# Patient Record
Sex: Male | Born: 1945
Health system: Southern US, Community
[De-identification: ages and names within clinical notes are randomized; demographics above are authoritative.]

## PROBLEM LIST (undated history)

## (undated) DIAGNOSIS — R519 Headache, unspecified: Secondary | ICD-10-CM

## (undated) DIAGNOSIS — I456 Pre-excitation syndrome: Secondary | ICD-10-CM

## (undated) DIAGNOSIS — M1990 Unspecified osteoarthritis, unspecified site: Secondary | ICD-10-CM

## (undated) DIAGNOSIS — T7840XA Allergy, unspecified, initial encounter: Secondary | ICD-10-CM

## (undated) DIAGNOSIS — K219 Gastro-esophageal reflux disease without esophagitis: Secondary | ICD-10-CM

## (undated) HISTORY — PX: JOINT REPLACEMENT: SHX530

---

## 1999-03-25 ENCOUNTER — Encounter: Payer: Self-pay | Admitting: Family Medicine

## 1999-03-25 ENCOUNTER — Emergency Department (HOSPITAL_COMMUNITY): Admission: EM | Admit: 1999-03-25 | Discharge: 1999-03-25 | Payer: Self-pay | Admitting: Emergency Medicine

## 2002-03-20 ENCOUNTER — Ambulatory Visit (HOSPITAL_COMMUNITY): Admission: RE | Admit: 2002-03-20 | Discharge: 2002-03-20 | Payer: Self-pay | Admitting: Family Medicine

## 2002-03-20 ENCOUNTER — Encounter: Payer: Self-pay | Admitting: Family Medicine

## 2003-01-23 ENCOUNTER — Ambulatory Visit (HOSPITAL_COMMUNITY): Admission: RE | Admit: 2003-01-23 | Discharge: 2003-01-23 | Payer: Self-pay | Admitting: Gastroenterology

## 2004-03-24 ENCOUNTER — Ambulatory Visit (HOSPITAL_COMMUNITY): Admission: RE | Admit: 2004-03-24 | Discharge: 2004-03-24 | Payer: Self-pay | Admitting: Family Medicine

## 2008-07-04 ENCOUNTER — Encounter: Admission: RE | Admit: 2008-07-04 | Discharge: 2008-07-04 | Payer: Self-pay | Admitting: Sports Medicine

## 2009-04-24 ENCOUNTER — Encounter: Admission: RE | Admit: 2009-04-24 | Discharge: 2009-04-24 | Payer: Self-pay | Admitting: Chiropractic Medicine

## 2009-06-28 ENCOUNTER — Encounter: Admission: RE | Admit: 2009-06-28 | Discharge: 2009-06-28 | Payer: Self-pay | Admitting: Neurological Surgery

## 2009-08-21 ENCOUNTER — Inpatient Hospital Stay (HOSPITAL_COMMUNITY): Admission: RE | Admit: 2009-08-21 | Discharge: 2009-08-23 | Payer: Self-pay | Admitting: Orthopedic Surgery

## 2010-05-11 LAB — PROTIME-INR
INR: 1.7 — ABNORMAL HIGH (ref 0.00–1.49)
INR: 0.99 (ref 0.00–1.49)
Prothrombin Time: 19.8 seconds — ABNORMAL HIGH (ref 11.6–15.2)
Prothrombin Time: 13 seconds (ref 11.6–15.2)
Prothrombin Time: 15 seconds (ref 11.6–15.2)

## 2010-05-11 LAB — CBC
HCT: 43.8 % (ref 39.0–52.0)
MCH: 33.4 pg (ref 26.0–34.0)
MCH: 33.5 pg (ref 26.0–34.0)
MCHC: 33.9 g/dL (ref 30.0–36.0)
MCV: 97.7 fL (ref 78.0–100.0)
MCV: 98.7 fL (ref 78.0–100.0)
Platelets: 136 10*3/uL — ABNORMAL LOW (ref 150–400)
RDW: 13.5 % (ref 11.5–15.5)
RDW: 13.8 % (ref 11.5–15.5)

## 2010-05-11 LAB — BASIC METABOLIC PANEL
BUN: 7 mg/dL (ref 6–23)
BUN: 11 mg/dL (ref 6–23)
CO2: 30 mEq/L (ref 19–32)
CO2: 33 mEq/L — ABNORMAL HIGH (ref 19–32)
Chloride: 101 mEq/L (ref 96–112)
Creatinine, Ser: 1.03 mg/dL (ref 0.4–1.5)
GFR calc Af Amer: >60 mL/min (ref >60)
GFR calc non Af Amer: >60 mL/min (ref >60)
Glucose, Bld: 130 mg/dL — ABNORMAL HIGH (ref 70–99)
Glucose, Bld: 109 mg/dL — ABNORMAL HIGH (ref 70–99)
Sodium: 137 mEq/L (ref 135–145)
Sodium: 137 mEq/L (ref 135–145)

## 2010-05-11 LAB — COMPREHENSIVE METABOLIC PANEL
Albumin: 4.1 g/dL (ref 3.5–5.2)
BUN: 14 mg/dL (ref 6–23)
Creatinine, Ser: 0.91 mg/dL (ref 0.4–1.5)
GFR calc Af Amer: >60 mL/min (ref >60)
GFR calc non Af Amer: >60 mL/min (ref >60)
Glucose, Bld: 122 mg/dL — ABNORMAL HIGH (ref 70–99)
Sodium: 140 mEq/L (ref 135–145)
Total Bilirubin: 0.8 mg/dL (ref 0.3–1.2)
Total Protein: 6.8 g/dL (ref 6.0–8.3)

## 2010-05-11 LAB — URINALYSIS, ROUTINE W REFLEX MICROSCOPIC
Hgb urine dipstick: NEGATIVE
pH: 5.5 (ref 5.0–8.0)

## 2010-05-11 LAB — ABO/RH: ABO/RH(D): A POS

## 2010-05-11 LAB — TYPE AND SCREEN
ABO/RH(D): A POS
Antibody Screen: NEGATIVE

## 2010-07-11 NOTE — Op Note (Signed)
NAME:  Brian Rush, Brian Rush                     ACCOUNT NO.:  [id number]   MEDICAL RECORD NO.:  [id number]                   PATIENT TYPE:  AMB   LOCATION:  ENDO                                 FACILITY:  [facility]   PHYSICIAN:  John C. Madilyn Fireman, M.D.                 DATE OF BIRTH:  10/27/1945   DATE OF PROCEDURE:  01/23/2003  DATE OF DISCHARGE:                                 OPERATIVE REPORT   INDICATIONS FOR PROCEDURE:  Colon cancer screening.   PROCEDURE:  The patient was placed in the left lateral decubitus position  and placed on the pulse monitor with continuous low flow oxygen delivered by  nasal cannula.  She was sedated with 75 mcg of IV fentanyl and 8 mg of IV  Versed.  The Olympus video colonoscope was inserted into the rectum and  advanced to the cecum, clinically confirmed by transillumination of  McBurney's point and visualization of the ileocecal valve and appendiceal  orifice.  The prep was excellent.  The cecum, ascending, transverse,  descending and sigmoid colon all appeared normal with no masses, polyps,  diverticula or other mucosal abnormalities.  The rectum likewise appeared  normal.  On retroflexed view, the anus revealed no obvious internal  hemorrhoids.  The scope was then withdrawn and the patient returned to the  recovery room in stable condition.  He tolerated the procedure well and  there were no immediate complications.   IMPRESSION:  Normal colonoscopy.   PLAN:  The next colon screening by sigmoidoscopy in five years.                                               John C. Madilyn Fireman, M.D.    [facility]  D:  01/23/2003  T:  01/23/2003  Job:  [id number]   cc:   Lyndee Leo, M.D.

## 2010-09-17 ENCOUNTER — Other Ambulatory Visit: Payer: Self-pay | Admitting: Family Medicine

## 2010-09-17 DIAGNOSIS — R221 Localized swelling, mass and lump, neck: Secondary | ICD-10-CM

## 2010-09-23 ENCOUNTER — Ambulatory Visit
Admission: RE | Admit: 2010-09-23 | Discharge: 2010-09-23 | Disposition: A | Discharge status: Home / Self Care | Payer: Medicare Other | Source: Ambulatory Visit | Attending: Family Medicine | Admitting: Family Medicine

## 2010-09-23 DIAGNOSIS — R221 Localized swelling, mass and lump, neck: Secondary | ICD-10-CM

## 2010-12-12 ENCOUNTER — Ambulatory Visit (INDEPENDENT_AMBULATORY_CARE_PROVIDER_SITE_OTHER): Payer: Medicare Other

## 2010-12-12 ENCOUNTER — Inpatient Hospital Stay (INDEPENDENT_AMBULATORY_CARE_PROVIDER_SITE_OTHER)
Admission: RE | Admit: 2010-12-12 | Discharge: 2010-12-12 | Disposition: A | Discharge status: Home / Self Care | Payer: Medicare Other | Source: Ambulatory Visit | Attending: Emergency Medicine | Admitting: Emergency Medicine

## 2010-12-12 DIAGNOSIS — S61209A Unspecified open wound of unspecified finger without damage to nail, initial encounter: Secondary | ICD-10-CM

## 2011-03-17 DIAGNOSIS — H524 Presbyopia: Secondary | ICD-10-CM | POA: Diagnosis not present

## 2011-04-21 DIAGNOSIS — S6000XA Contusion of unspecified finger without damage to nail, initial encounter: Secondary | ICD-10-CM | POA: Diagnosis not present

## 2011-04-23 DIAGNOSIS — S63659A Sprain of metacarpophalangeal joint of unspecified finger, initial encounter: Secondary | ICD-10-CM | POA: Diagnosis not present

## 2011-04-24 ENCOUNTER — Other Ambulatory Visit: Payer: Self-pay | Admitting: Orthopedic Surgery

## 2011-04-24 DIAGNOSIS — M79644 Pain in right finger(s): Secondary | ICD-10-CM

## 2011-04-30 ENCOUNTER — Ambulatory Visit
Admission: RE | Admit: 2011-04-30 | Discharge: 2011-04-30 | Disposition: A | Discharge status: Home / Self Care | Payer: Medicare Other | Source: Ambulatory Visit | Attending: Orthopedic Surgery | Admitting: Orthopedic Surgery

## 2011-04-30 DIAGNOSIS — S6000XA Contusion of unspecified finger without damage to nail, initial encounter: Secondary | ICD-10-CM | POA: Diagnosis not present

## 2011-04-30 DIAGNOSIS — M79644 Pain in right finger(s): Secondary | ICD-10-CM

## 2011-04-30 DIAGNOSIS — M79609 Pain in unspecified limb: Secondary | ICD-10-CM | POA: Diagnosis not present

## 2011-04-30 DIAGNOSIS — S6390XA Sprain of unspecified part of unspecified wrist and hand, initial encounter: Secondary | ICD-10-CM | POA: Diagnosis not present

## 2011-04-30 DIAGNOSIS — S63259A Unspecified dislocation of unspecified finger, initial encounter: Secondary | ICD-10-CM | POA: Diagnosis not present

## 2011-04-30 DIAGNOSIS — S63659A Sprain of metacarpophalangeal joint of unspecified finger, initial encounter: Secondary | ICD-10-CM | POA: Diagnosis not present

## 2011-04-30 MED ORDER — IOHEXOL 180 MG/ML  SOLN
0.5000 mL | Freq: Once | INTRAMUSCULAR | Status: AC | PRN
  Administered 2011-04-30: 0.5 mL via INTRA_ARTICULAR

## 2011-05-11 DIAGNOSIS — Y929 Unspecified place or not applicable: Secondary | ICD-10-CM | POA: Diagnosis not present

## 2011-05-11 DIAGNOSIS — X58XXXA Exposure to other specified factors, initial encounter: Secondary | ICD-10-CM | POA: Diagnosis not present

## 2011-05-11 DIAGNOSIS — Y9389 Activity, other specified: Secondary | ICD-10-CM | POA: Diagnosis not present

## 2011-05-11 DIAGNOSIS — Y998 Other external cause status: Secondary | ICD-10-CM | POA: Diagnosis not present

## 2011-05-11 DIAGNOSIS — S63659A Sprain of metacarpophalangeal joint of unspecified finger, initial encounter: Secondary | ICD-10-CM | POA: Diagnosis not present

## 2011-07-09 DIAGNOSIS — S63659A Sprain of metacarpophalangeal joint of unspecified finger, initial encounter: Secondary | ICD-10-CM | POA: Diagnosis not present

## 2012-01-15 DIAGNOSIS — Z0189 Encounter for other specified special examinations: Secondary | ICD-10-CM | POA: Diagnosis not present

## 2012-01-15 DIAGNOSIS — M1610 Unilateral primary osteoarthritis, unspecified hip: Secondary | ICD-10-CM | POA: Diagnosis not present

## 2012-05-10 DIAGNOSIS — Z0000 Encounter for general adult medical examination without abnormal findings: Secondary | ICD-10-CM | POA: Diagnosis not present

## 2012-05-10 DIAGNOSIS — J309 Allergic rhinitis, unspecified: Secondary | ICD-10-CM | POA: Diagnosis not present

## 2012-05-10 DIAGNOSIS — Z1211 Encounter for screening for malignant neoplasm of colon: Secondary | ICD-10-CM | POA: Diagnosis not present

## 2012-05-10 DIAGNOSIS — E782 Mixed hyperlipidemia: Secondary | ICD-10-CM | POA: Diagnosis not present

## 2012-05-10 DIAGNOSIS — N529 Male erectile dysfunction, unspecified: Secondary | ICD-10-CM | POA: Diagnosis not present

## 2012-05-10 DIAGNOSIS — Z125 Encounter for screening for malignant neoplasm of prostate: Secondary | ICD-10-CM | POA: Diagnosis not present

## 2012-05-10 DIAGNOSIS — N400 Enlarged prostate without lower urinary tract symptoms: Secondary | ICD-10-CM | POA: Diagnosis not present

## 2012-08-01 DIAGNOSIS — Z209 Contact with and (suspected) exposure to unspecified communicable disease: Secondary | ICD-10-CM | POA: Diagnosis not present

## 2012-08-01 DIAGNOSIS — N529 Male erectile dysfunction, unspecified: Secondary | ICD-10-CM | POA: Diagnosis not present

## 2012-08-01 DIAGNOSIS — E782 Mixed hyperlipidemia: Secondary | ICD-10-CM | POA: Diagnosis not present

## 2012-08-01 DIAGNOSIS — Z23 Encounter for immunization: Secondary | ICD-10-CM | POA: Diagnosis not present

## 2012-08-01 DIAGNOSIS — J309 Allergic rhinitis, unspecified: Secondary | ICD-10-CM | POA: Diagnosis not present

## 2012-08-12 DIAGNOSIS — T56894A Toxic effect of other metals, undetermined, initial encounter: Secondary | ICD-10-CM | POA: Diagnosis not present

## 2012-08-12 DIAGNOSIS — M169 Osteoarthritis of hip, unspecified: Secondary | ICD-10-CM | POA: Diagnosis not present

## 2012-08-12 DIAGNOSIS — M1610 Unilateral primary osteoarthritis, unspecified hip: Secondary | ICD-10-CM | POA: Diagnosis not present

## 2012-08-30 DIAGNOSIS — M1610 Unilateral primary osteoarthritis, unspecified hip: Secondary | ICD-10-CM | POA: Diagnosis not present

## 2013-02-07 DIAGNOSIS — Z1211 Encounter for screening for malignant neoplasm of colon: Secondary | ICD-10-CM | POA: Diagnosis not present

## 2013-02-07 DIAGNOSIS — K5730 Diverticulosis of large intestine without perforation or abscess without bleeding: Secondary | ICD-10-CM | POA: Diagnosis not present

## 2013-02-07 DIAGNOSIS — Z800 Family history of malignant neoplasm of digestive organs: Secondary | ICD-10-CM | POA: Diagnosis not present

## 2013-04-27 DIAGNOSIS — H2510 Age-related nuclear cataract, unspecified eye: Secondary | ICD-10-CM | POA: Diagnosis not present

## 2013-07-10 DIAGNOSIS — N529 Male erectile dysfunction, unspecified: Secondary | ICD-10-CM | POA: Diagnosis not present

## 2013-07-10 DIAGNOSIS — J309 Allergic rhinitis, unspecified: Secondary | ICD-10-CM | POA: Diagnosis not present

## 2013-07-10 DIAGNOSIS — E782 Mixed hyperlipidemia: Secondary | ICD-10-CM | POA: Diagnosis not present

## 2013-07-10 DIAGNOSIS — Z125 Encounter for screening for malignant neoplasm of prostate: Secondary | ICD-10-CM | POA: Diagnosis not present

## 2013-07-10 DIAGNOSIS — Z1211 Encounter for screening for malignant neoplasm of colon: Secondary | ICD-10-CM | POA: Diagnosis not present

## 2013-07-10 DIAGNOSIS — N400 Enlarged prostate without lower urinary tract symptoms: Secondary | ICD-10-CM | POA: Diagnosis not present

## 2013-07-10 DIAGNOSIS — K219 Gastro-esophageal reflux disease without esophagitis: Secondary | ICD-10-CM | POA: Diagnosis not present

## 2013-07-10 DIAGNOSIS — Z0000 Encounter for general adult medical examination without abnormal findings: Secondary | ICD-10-CM | POA: Diagnosis not present

## 2013-07-10 DIAGNOSIS — Z23 Encounter for immunization: Secondary | ICD-10-CM | POA: Diagnosis not present

## 2013-12-21 DIAGNOSIS — L821 Other seborrheic keratosis: Secondary | ICD-10-CM | POA: Diagnosis not present

## 2013-12-21 DIAGNOSIS — L570 Actinic keratosis: Secondary | ICD-10-CM | POA: Diagnosis not present

## 2013-12-21 DIAGNOSIS — D1801 Hemangioma of skin and subcutaneous tissue: Secondary | ICD-10-CM | POA: Diagnosis not present

## 2014-03-19 DIAGNOSIS — R202 Paresthesia of skin: Secondary | ICD-10-CM | POA: Diagnosis not present

## 2014-03-19 DIAGNOSIS — N390 Urinary tract infection, site not specified: Secondary | ICD-10-CM | POA: Diagnosis not present

## 2014-03-19 DIAGNOSIS — R300 Dysuria: Secondary | ICD-10-CM | POA: Diagnosis not present

## 2014-03-19 DIAGNOSIS — N529 Male erectile dysfunction, unspecified: Secondary | ICD-10-CM | POA: Diagnosis not present

## 2014-07-16 DIAGNOSIS — Z1211 Encounter for screening for malignant neoplasm of colon: Secondary | ICD-10-CM | POA: Diagnosis not present

## 2014-07-16 DIAGNOSIS — Z209 Contact with and (suspected) exposure to unspecified communicable disease: Secondary | ICD-10-CM | POA: Diagnosis not present

## 2014-07-16 DIAGNOSIS — Z125 Encounter for screening for malignant neoplasm of prostate: Secondary | ICD-10-CM | POA: Diagnosis not present

## 2014-07-16 DIAGNOSIS — L84 Corns and callosities: Secondary | ICD-10-CM | POA: Diagnosis not present

## 2014-07-16 DIAGNOSIS — Z0000 Encounter for general adult medical examination without abnormal findings: Secondary | ICD-10-CM | POA: Diagnosis not present

## 2014-07-16 DIAGNOSIS — H6120 Impacted cerumen, unspecified ear: Secondary | ICD-10-CM | POA: Diagnosis not present

## 2014-07-16 DIAGNOSIS — Z1389 Encounter for screening for other disorder: Secondary | ICD-10-CM | POA: Diagnosis not present

## 2014-07-16 DIAGNOSIS — J309 Allergic rhinitis, unspecified: Secondary | ICD-10-CM | POA: Diagnosis not present

## 2014-07-16 DIAGNOSIS — K219 Gastro-esophageal reflux disease without esophagitis: Secondary | ICD-10-CM | POA: Diagnosis not present

## 2014-07-16 DIAGNOSIS — N529 Male erectile dysfunction, unspecified: Secondary | ICD-10-CM | POA: Diagnosis not present

## 2014-07-16 DIAGNOSIS — N400 Enlarged prostate without lower urinary tract symptoms: Secondary | ICD-10-CM | POA: Diagnosis not present

## 2014-07-16 DIAGNOSIS — E782 Mixed hyperlipidemia: Secondary | ICD-10-CM | POA: Diagnosis not present

## 2014-07-30 ENCOUNTER — Ambulatory Visit (INDEPENDENT_AMBULATORY_CARE_PROVIDER_SITE_OTHER): Payer: Medicare Other | Admitting: Podiatry

## 2014-07-30 ENCOUNTER — Encounter: Payer: Self-pay | Admitting: Podiatry

## 2014-07-30 ENCOUNTER — Ambulatory Visit (INDEPENDENT_AMBULATORY_CARE_PROVIDER_SITE_OTHER): Payer: Medicare Other

## 2014-07-30 VITALS — BP 112/66 | HR 64 | Resp 16

## 2014-07-30 DIAGNOSIS — M779 Enthesopathy, unspecified: Secondary | ICD-10-CM

## 2014-07-30 DIAGNOSIS — R52 Pain, unspecified: Secondary | ICD-10-CM | POA: Diagnosis not present

## 2014-07-30 DIAGNOSIS — L84 Corns and callosities: Secondary | ICD-10-CM | POA: Diagnosis not present

## 2014-07-30 DIAGNOSIS — Q667 Congenital pes cavus: Secondary | ICD-10-CM

## 2014-07-30 DIAGNOSIS — M216X9 Other acquired deformities of unspecified foot: Secondary | ICD-10-CM

## 2014-07-30 MED ORDER — TRIAMCINOLONE ACETONIDE 10 MG/ML IJ SUSP
10.0000 mg | Freq: Once | INTRAMUSCULAR | Status: AC
  Administered 2014-07-30: 10 mg

## 2014-07-30 NOTE — Progress Notes (Signed)
° °  Subjective:    Patient ID: Brian Rush, male    DOB: 1945-08-29, 69 y.o.   MRN: [id number]  HPI   No pain more irritable and bothers with pressure of shoes, happened gradual and acured 2 yrs ago and been shaving it with a razor as a treatment    Review of Systems  Musculoskeletal:       Muscle pain and cramps  All other systems reviewed and are negative.      Objective:   Physical Exam        Assessment & Plan:

## 2014-07-31 NOTE — Progress Notes (Signed)
Subjective:     Patient ID: Brian Rush, male   DOB: 1945/11/22, 69 y.o.   MRN: [id number]  HPI patient states she's getting inflammation and pain around the fifth metatarsal left foot with fluid buildup and there is been keratotic lesion formation that's becoming gradually more tender   Review of Systems  All other systems reviewed and are negative.      Objective:   Physical Exam  Constitutional: He is oriented to person, place, and time.  Cardiovascular: Intact distal pulses.   Musculoskeletal: Normal range of motion.  Neurological: He is oriented to person, place, and time.  Skin: Skin is warm.  Nursing note and vitals reviewed.  neurovascular status intact with muscle strength adequate range of motion within normal limits. Patient's found have good digital perfusion and is well oriented 3 with several lesions on the lateral aspect fifth metatarsal head left with lucent course that are painful when pressed with fluid buildup in the sub-metatarsal area     Assessment:     Inflammatory capsulitis fifth MPJ left with keratotic lesion formation and probable inflammatory changes    Plan:     H&P and condition reviewed and x-rays reviewed with patient. Today I carefully injected the left fifth MPJ plantar capsule 3 mg dexamethasone Kenalog 5 mg Xylocaine and then debrided the lesion fully and applied sterile dressing. Reappoint when symptomatic again

## 2014-10-09 DIAGNOSIS — H2513 Age-related nuclear cataract, bilateral: Secondary | ICD-10-CM | POA: Diagnosis not present

## 2014-12-27 DIAGNOSIS — L218 Other seborrheic dermatitis: Secondary | ICD-10-CM | POA: Diagnosis not present

## 2014-12-27 DIAGNOSIS — D225 Melanocytic nevi of trunk: Secondary | ICD-10-CM | POA: Diagnosis not present

## 2014-12-27 DIAGNOSIS — L570 Actinic keratosis: Secondary | ICD-10-CM | POA: Diagnosis not present

## 2014-12-27 DIAGNOSIS — L821 Other seborrheic keratosis: Secondary | ICD-10-CM | POA: Diagnosis not present

## 2014-12-27 DIAGNOSIS — D1801 Hemangioma of skin and subcutaneous tissue: Secondary | ICD-10-CM | POA: Diagnosis not present

## 2014-12-27 DIAGNOSIS — D485 Neoplasm of uncertain behavior of skin: Secondary | ICD-10-CM | POA: Diagnosis not present

## 2015-01-22 DIAGNOSIS — N529 Male erectile dysfunction, unspecified: Secondary | ICD-10-CM | POA: Diagnosis not present

## 2015-01-22 DIAGNOSIS — Z23 Encounter for immunization: Secondary | ICD-10-CM | POA: Diagnosis not present

## 2015-01-22 DIAGNOSIS — E782 Mixed hyperlipidemia: Secondary | ICD-10-CM | POA: Diagnosis not present

## 2015-01-22 DIAGNOSIS — J309 Allergic rhinitis, unspecified: Secondary | ICD-10-CM | POA: Diagnosis not present

## 2015-01-22 DIAGNOSIS — K219 Gastro-esophageal reflux disease without esophagitis: Secondary | ICD-10-CM | POA: Diagnosis not present

## 2015-02-13 DIAGNOSIS — D485 Neoplasm of uncertain behavior of skin: Secondary | ICD-10-CM | POA: Diagnosis not present

## 2015-02-13 DIAGNOSIS — C44712 Basal cell carcinoma of skin of right lower limb, including hip: Secondary | ICD-10-CM | POA: Diagnosis not present

## 2015-03-06 DIAGNOSIS — C44712 Basal cell carcinoma of skin of right lower limb, including hip: Secondary | ICD-10-CM | POA: Diagnosis not present

## 2015-08-22 DIAGNOSIS — K219 Gastro-esophageal reflux disease without esophagitis: Secondary | ICD-10-CM | POA: Diagnosis not present

## 2015-08-22 DIAGNOSIS — J309 Allergic rhinitis, unspecified: Secondary | ICD-10-CM | POA: Diagnosis not present

## 2015-08-22 DIAGNOSIS — M25532 Pain in left wrist: Secondary | ICD-10-CM | POA: Diagnosis not present

## 2015-08-22 DIAGNOSIS — N529 Male erectile dysfunction, unspecified: Secondary | ICD-10-CM | POA: Diagnosis not present

## 2015-08-22 DIAGNOSIS — E782 Mixed hyperlipidemia: Secondary | ICD-10-CM | POA: Diagnosis not present

## 2015-08-22 DIAGNOSIS — Z1389 Encounter for screening for other disorder: Secondary | ICD-10-CM | POA: Diagnosis not present

## 2015-08-22 DIAGNOSIS — Z1211 Encounter for screening for malignant neoplasm of colon: Secondary | ICD-10-CM | POA: Diagnosis not present

## 2015-08-22 DIAGNOSIS — Z125 Encounter for screening for malignant neoplasm of prostate: Secondary | ICD-10-CM | POA: Diagnosis not present

## 2015-08-22 DIAGNOSIS — Z0000 Encounter for general adult medical examination without abnormal findings: Secondary | ICD-10-CM | POA: Diagnosis not present

## 2015-10-10 DIAGNOSIS — H524 Presbyopia: Secondary | ICD-10-CM | POA: Diagnosis not present

## 2015-10-10 DIAGNOSIS — H2511 Age-related nuclear cataract, right eye: Secondary | ICD-10-CM | POA: Diagnosis not present

## 2015-10-10 DIAGNOSIS — H5213 Myopia, bilateral: Secondary | ICD-10-CM | POA: Diagnosis not present

## 2016-01-06 DIAGNOSIS — D1801 Hemangioma of skin and subcutaneous tissue: Secondary | ICD-10-CM | POA: Diagnosis not present

## 2016-01-06 DIAGNOSIS — Z85828 Personal history of other malignant neoplasm of skin: Secondary | ICD-10-CM | POA: Diagnosis not present

## 2016-01-06 DIAGNOSIS — L821 Other seborrheic keratosis: Secondary | ICD-10-CM | POA: Diagnosis not present

## 2016-01-06 DIAGNOSIS — L570 Actinic keratosis: Secondary | ICD-10-CM | POA: Diagnosis not present

## 2016-03-02 ENCOUNTER — Encounter: Payer: Self-pay | Admitting: Podiatry

## 2016-03-02 ENCOUNTER — Ambulatory Visit (INDEPENDENT_AMBULATORY_CARE_PROVIDER_SITE_OTHER): Payer: Medicare Other | Admitting: Podiatry

## 2016-03-02 DIAGNOSIS — L84 Corns and callosities: Secondary | ICD-10-CM

## 2016-03-02 DIAGNOSIS — M779 Enthesopathy, unspecified: Secondary | ICD-10-CM

## 2016-03-02 MED ORDER — TRIAMCINOLONE ACETONIDE 10 MG/ML IJ SUSP
10.0000 mg | Freq: Once | INTRAMUSCULAR | Status: AC
  Administered 2016-03-02: 10 mg

## 2016-03-04 NOTE — Progress Notes (Signed)
Subjective:     Patient ID: Brian Rush, male   DOB: December 12, 1945, 71 y.o.   MRN: [id number]  HPI patient presents stating I developed a lot of inflammation again in the bottom my left foot and I'm going to [place] hiking   Review of Systems     Objective:   Physical Exam Neurovascular status intact muscle strength adequate with inflammation and pain sub-fifth metatarsal head left with fluid buildup around the joint with tissue formation and thick keratotic lesion formation    Assessment:     Inflammatory capsulitis fifth MPJ left with pain and deformity    Plan:     H&P condition explained to patient and careful injection administered 3 mg dexamethasone Kenalog and then instructed on debridement which was accomplished today and the fact this will recur and most likely one point in future

## 2016-04-08 ENCOUNTER — Encounter: Payer: Self-pay | Admitting: Podiatry

## 2016-04-08 ENCOUNTER — Ambulatory Visit (INDEPENDENT_AMBULATORY_CARE_PROVIDER_SITE_OTHER): Payer: Medicare Other | Admitting: Podiatry

## 2016-04-08 ENCOUNTER — Ambulatory Visit (INDEPENDENT_AMBULATORY_CARE_PROVIDER_SITE_OTHER): Payer: Medicare Other

## 2016-04-08 VITALS — BP 125/71 | HR 64 | Resp 16

## 2016-04-08 DIAGNOSIS — D169 Benign neoplasm of bone and articular cartilage, unspecified: Secondary | ICD-10-CM

## 2016-04-08 DIAGNOSIS — L84 Corns and callosities: Secondary | ICD-10-CM | POA: Diagnosis not present

## 2016-04-09 NOTE — Progress Notes (Signed)
Subjective:     Patient ID: Brian Rush, male   DOB: [birth year], 71 y.o.   MRN: [id number]  HPI patient presents with a lot of pain fifth digit left foot and he's getting ready to go to [place]   Review of Systems     Objective:   Physical Exam Neurovascular status intact negative Homans sign noted with patient found to have distal medial keratotic lesion fifth digit left that's very painful    Assessment:     Lesion secondary to bony exostotic lesion    Plan:     H&P x-ray performed debridement accomplished sterile dressing applied padding applied and instructed that this may require exostectomy. With education given concerning exostosis and treatment options  X-ray report indicated spurring of the medial side fifth digit left

## 2016-10-21 DIAGNOSIS — H2511 Age-related nuclear cataract, right eye: Secondary | ICD-10-CM | POA: Diagnosis not present

## 2016-11-30 ENCOUNTER — Encounter: Payer: Self-pay | Admitting: Podiatry

## 2016-11-30 ENCOUNTER — Ambulatory Visit (INDEPENDENT_AMBULATORY_CARE_PROVIDER_SITE_OTHER): Payer: Medicare Other | Admitting: Podiatry

## 2016-11-30 VITALS — BP 118/73 | HR 59 | Resp 16

## 2016-11-30 DIAGNOSIS — D169 Benign neoplasm of bone and articular cartilage, unspecified: Secondary | ICD-10-CM

## 2016-11-30 DIAGNOSIS — L84 Corns and callosities: Secondary | ICD-10-CM

## 2016-11-30 NOTE — Progress Notes (Signed)
Subjective:    Patient ID: Brian Rush, male   DOB: 71 y.o.   MRN: [id number]   HPI patient presents with a very painful lesion on the inside of the fifth toe left that makes shoe gear difficult and states that it feels like a pin is sticking him and his toe    ROS      Objective:  Physical Exam neurovascular status intact with exostotic keratotic lesion inside fifth toe left it's very painful when pressed     Assessment:    Exostosis fifth digit left     Plan:    Sharp debridement accomplished and discussed surgical exostectomy which she wants done but wants to wait we'll December. I allowed to read him consent form explaining to him procedure and alternative procedures and also complications. Patient wants surgery signed consent form and is scheduled for office surgery for exostectomy with removal of bone spur

## 2016-12-14 ENCOUNTER — Other Ambulatory Visit: Payer: Self-pay | Admitting: Family Medicine

## 2016-12-14 ENCOUNTER — Ambulatory Visit
Admission: RE | Admit: 2016-12-14 | Discharge: 2016-12-14 | Disposition: A | Discharge status: Home / Self Care | Payer: Medicare Other | Source: Ambulatory Visit | Attending: Family Medicine | Admitting: Family Medicine

## 2016-12-14 DIAGNOSIS — E782 Mixed hyperlipidemia: Secondary | ICD-10-CM | POA: Diagnosis not present

## 2016-12-14 DIAGNOSIS — M189 Osteoarthritis of first carpometacarpal joint, unspecified: Secondary | ICD-10-CM | POA: Diagnosis not present

## 2016-12-14 DIAGNOSIS — Z23 Encounter for immunization: Secondary | ICD-10-CM | POA: Diagnosis not present

## 2016-12-14 DIAGNOSIS — N529 Male erectile dysfunction, unspecified: Secondary | ICD-10-CM | POA: Diagnosis not present

## 2016-12-14 DIAGNOSIS — M79644 Pain in right finger(s): Secondary | ICD-10-CM | POA: Diagnosis not present

## 2016-12-14 DIAGNOSIS — K219 Gastro-esophageal reflux disease without esophagitis: Secondary | ICD-10-CM | POA: Diagnosis not present

## 2016-12-14 DIAGNOSIS — J309 Allergic rhinitis, unspecified: Secondary | ICD-10-CM | POA: Diagnosis not present

## 2017-01-11 DIAGNOSIS — D485 Neoplasm of uncertain behavior of skin: Secondary | ICD-10-CM | POA: Diagnosis not present

## 2017-01-11 DIAGNOSIS — L570 Actinic keratosis: Secondary | ICD-10-CM | POA: Diagnosis not present

## 2017-01-11 DIAGNOSIS — L821 Other seborrheic keratosis: Secondary | ICD-10-CM | POA: Diagnosis not present

## 2017-01-11 DIAGNOSIS — D0422 Carcinoma in situ of skin of left ear and external auricular canal: Secondary | ICD-10-CM | POA: Diagnosis not present

## 2017-01-11 DIAGNOSIS — C44712 Basal cell carcinoma of skin of right lower limb, including hip: Secondary | ICD-10-CM | POA: Diagnosis not present

## 2017-01-11 DIAGNOSIS — D1801 Hemangioma of skin and subcutaneous tissue: Secondary | ICD-10-CM | POA: Diagnosis not present

## 2017-02-02 ENCOUNTER — Telehealth: Payer: Self-pay | Admitting: *Deleted

## 2017-02-02 NOTE — Telephone Encounter (Signed)
"  I'm scheduled for surgery with Dr. Ila Mcgill on tomorrow morning.  That's Wednesday morning.  I wanted to check to see if things are still on.  If you could give me a call back.  I'd appreciate it."  I'm returning your call.  You are still scheduled for surgery on tomorrow.  However, we are opening a little later.  Your surgery will be at 9 am.  "What time do I need to be there?"  If you can, be here at 8:45 am.  "Okay, I'll be there.  Will I be able to drive or should I bring my wife with me?"  You will be okay to drive it's your left foot that he's doing.  He'll put you in a surgical shoe afterwards.  "How long will I have to wear that shoe?"  You may be in it for a week or two.  It really depends on how it feels to you.

## 2017-02-03 ENCOUNTER — Ambulatory Visit (INDEPENDENT_AMBULATORY_CARE_PROVIDER_SITE_OTHER): Payer: Medicare Other | Admitting: Podiatry

## 2017-02-03 ENCOUNTER — Encounter: Payer: Self-pay | Admitting: Podiatry

## 2017-02-03 VITALS — BP 118/68 | HR 61 | Temp 96.7°F | Resp 16

## 2017-02-03 DIAGNOSIS — D169 Benign neoplasm of bone and articular cartilage, unspecified: Secondary | ICD-10-CM

## 2017-02-03 NOTE — Progress Notes (Signed)
Subjective:   Patient ID: Brian Rush, male   DOB: 71 y.o.   MRN: [id number]   HPI Patient presents with chronic lesion fifth digit left it has been very painful when pressed and making shoe gear difficult.  Presents for surgical intervention.   ROS      Objective:  Physical Exam  Neurovascular status intact negative Homans sign noted with good digital perfusion noted.  Patient has a distal medial exostosis digit 5 left.     Assessment:  Chronic keratotic lesion digit 5 left distal medial.      Plan:  At this time patient was anesthetized with 60 mg like Marcaine mixture and I then went ahead and this patient was taken to the OR with sterile prep applied.  Tourniquet was inflated 250 mmHg and the following procedure was performed.  Attention was directed to the dorsal medial aspect digit 5 left where a small semielliptical incision was made encompassing the lesion on the fifth digit.  The incision was deepened through subcu tissue down to bone and the intervening skin was removed in toto.  Utilizing a side cutting bur we removed all bone spur and created a paste which was removed from the wound and it was found to be satisfactory resected.  I flushed the wound with copious amounts sterile Garamycin solution and sutured with 4-0 nylon and applied sterile dressing.  Instructed on elevation and reappoint 2 weeks or earlier if any issues should occur and tourniquet was released and patient left the OR in satisfactory condition.  Dispensed a surgical shoe

## 2017-02-17 ENCOUNTER — Encounter: Payer: Medicare Other | Admitting: Podiatry

## 2017-02-18 ENCOUNTER — Ambulatory Visit (INDEPENDENT_AMBULATORY_CARE_PROVIDER_SITE_OTHER): Payer: Medicare Other

## 2017-02-18 ENCOUNTER — Encounter: Payer: Self-pay | Admitting: Podiatry

## 2017-02-18 ENCOUNTER — Ambulatory Visit (INDEPENDENT_AMBULATORY_CARE_PROVIDER_SITE_OTHER): Payer: Medicare Other | Admitting: Podiatry

## 2017-02-18 DIAGNOSIS — D169 Benign neoplasm of bone and articular cartilage, unspecified: Secondary | ICD-10-CM

## 2017-02-22 NOTE — Progress Notes (Signed)
Subjective: Brian Rush is a 71 y.o. is seen today in office s/p left 5th digit exostectomy preformed on 02/03/2017 with Dr. Paulla Dolly.  Patient states doing well and that "my toe feels fine".  He presents today for suture removal.  Denies any recent injury or trauma.  He has been wearing a surgical shoe.  Denies any systemic complaints such as fevers, chills, nausea, vomiting. No calf pain, chest pain, shortness of breath.   Objective: General: No acute distress, AAOx3  DP/PT pulses palpable 2/4, CRT < 3 sec to all digits.  Protective sensation intact. Motor function intact.  LEFT foot: Incision is well coapted without any evidence of dehiscence and sutures are intact. There is no surrounding erythema, ascending cellulitis, fluctuance, crepitus, malodor, drainage/purulence. There is trace edema around the surgical site. There is no pain along the surgical site.  Toes and rectus position. No other areas of tenderness to bilateral lower extremities.  No other open lesions or pre-ulcerative lesions.  No pain with calf compression, swelling, warmth, erythema.   Assessment and Plan:  Status post left fifth toe surgery, doing well with no complications   -Treatment options discussed including all alternatives, risks, and complications -X-rays were obtained and reviewed.  Status post exostectomy left fifth toe.  No evidence of acute fracture identified otherwise. -Sutures removed today without complications.  After removal incision remains well coapted.  Antibiotic ointment was applied followed by a bandage.  He can start to shower tomorrow.  Gradual return to activity. -He can start to transition to regular shoe as able. -Ice/elevation -Pain medication as needed- He has not been taking anything.  -Monitor for any clinical signs or symptoms of infection and DVT/PE and directed to call the office immediately should any occur or go to the ER. -Follow-up as scheduled if he is having any issues or  sooner if any problems arise. In the meantime, encouraged to call the office with any questions, concerns, change in symptoms.   Celesta Gentile, DPM

## 2017-07-27 DIAGNOSIS — E782 Mixed hyperlipidemia: Secondary | ICD-10-CM | POA: Diagnosis not present

## 2017-07-27 DIAGNOSIS — J309 Allergic rhinitis, unspecified: Secondary | ICD-10-CM | POA: Diagnosis not present

## 2017-07-27 DIAGNOSIS — Z0000 Encounter for general adult medical examination without abnormal findings: Secondary | ICD-10-CM | POA: Diagnosis not present

## 2017-07-27 DIAGNOSIS — Z1389 Encounter for screening for other disorder: Secondary | ICD-10-CM | POA: Diagnosis not present

## 2017-07-27 DIAGNOSIS — N529 Male erectile dysfunction, unspecified: Secondary | ICD-10-CM | POA: Diagnosis not present

## 2017-07-27 DIAGNOSIS — Z125 Encounter for screening for malignant neoplasm of prostate: Secondary | ICD-10-CM | POA: Diagnosis not present

## 2017-07-27 DIAGNOSIS — Z1211 Encounter for screening for malignant neoplasm of colon: Secondary | ICD-10-CM | POA: Diagnosis not present

## 2017-07-27 DIAGNOSIS — Z1159 Encounter for screening for other viral diseases: Secondary | ICD-10-CM | POA: Diagnosis not present

## 2017-07-27 DIAGNOSIS — R202 Paresthesia of skin: Secondary | ICD-10-CM | POA: Diagnosis not present

## 2017-07-27 DIAGNOSIS — K219 Gastro-esophageal reflux disease without esophagitis: Secondary | ICD-10-CM | POA: Diagnosis not present

## 2017-07-27 DIAGNOSIS — J069 Acute upper respiratory infection, unspecified: Secondary | ICD-10-CM | POA: Diagnosis not present

## 2017-11-17 DIAGNOSIS — H2513 Age-related nuclear cataract, bilateral: Secondary | ICD-10-CM | POA: Diagnosis not present

## 2018-01-11 DIAGNOSIS — D485 Neoplasm of uncertain behavior of skin: Secondary | ICD-10-CM | POA: Diagnosis not present

## 2018-01-11 DIAGNOSIS — Z85828 Personal history of other malignant neoplasm of skin: Secondary | ICD-10-CM | POA: Diagnosis not present

## 2018-01-11 DIAGNOSIS — L570 Actinic keratosis: Secondary | ICD-10-CM | POA: Diagnosis not present

## 2018-01-11 DIAGNOSIS — D0422 Carcinoma in situ of skin of left ear and external auricular canal: Secondary | ICD-10-CM | POA: Diagnosis not present

## 2018-01-11 DIAGNOSIS — L821 Other seborrheic keratosis: Secondary | ICD-10-CM | POA: Diagnosis not present

## 2018-01-26 DIAGNOSIS — Z23 Encounter for immunization: Secondary | ICD-10-CM | POA: Diagnosis not present

## 2018-04-12 DIAGNOSIS — L570 Actinic keratosis: Secondary | ICD-10-CM | POA: Diagnosis not present

## 2018-05-03 DIAGNOSIS — M25562 Pain in left knee: Secondary | ICD-10-CM | POA: Diagnosis not present

## 2018-07-04 DIAGNOSIS — M1712 Unilateral primary osteoarthritis, left knee: Secondary | ICD-10-CM | POA: Diagnosis not present

## 2018-07-11 DIAGNOSIS — M1712 Unilateral primary osteoarthritis, left knee: Secondary | ICD-10-CM | POA: Diagnosis not present

## 2018-07-19 DIAGNOSIS — M1712 Unilateral primary osteoarthritis, left knee: Secondary | ICD-10-CM | POA: Diagnosis not present

## 2018-07-29 DIAGNOSIS — Z0000 Encounter for general adult medical examination without abnormal findings: Secondary | ICD-10-CM | POA: Diagnosis not present

## 2018-07-29 DIAGNOSIS — N529 Male erectile dysfunction, unspecified: Secondary | ICD-10-CM | POA: Diagnosis not present

## 2018-07-29 DIAGNOSIS — K219 Gastro-esophageal reflux disease without esophagitis: Secondary | ICD-10-CM | POA: Diagnosis not present

## 2018-07-29 DIAGNOSIS — M1711 Unilateral primary osteoarthritis, right knee: Secondary | ICD-10-CM | POA: Diagnosis not present

## 2018-07-29 DIAGNOSIS — R5383 Other fatigue: Secondary | ICD-10-CM | POA: Diagnosis not present

## 2018-07-29 DIAGNOSIS — J309 Allergic rhinitis, unspecified: Secondary | ICD-10-CM | POA: Diagnosis not present

## 2018-07-29 DIAGNOSIS — R600 Localized edema: Secondary | ICD-10-CM | POA: Diagnosis not present

## 2018-07-29 DIAGNOSIS — Z1211 Encounter for screening for malignant neoplasm of colon: Secondary | ICD-10-CM | POA: Diagnosis not present

## 2018-07-29 DIAGNOSIS — E782 Mixed hyperlipidemia: Secondary | ICD-10-CM | POA: Diagnosis not present

## 2018-07-29 DIAGNOSIS — Z1389 Encounter for screening for other disorder: Secondary | ICD-10-CM | POA: Diagnosis not present

## 2018-07-29 DIAGNOSIS — Z125 Encounter for screening for malignant neoplasm of prostate: Secondary | ICD-10-CM | POA: Diagnosis not present

## 2018-08-03 DIAGNOSIS — I8312 Varicose veins of left lower extremity with inflammation: Secondary | ICD-10-CM | POA: Diagnosis not present

## 2018-08-03 DIAGNOSIS — I83813 Varicose veins of bilateral lower extremities with pain: Secondary | ICD-10-CM | POA: Diagnosis not present

## 2018-08-03 DIAGNOSIS — I83893 Varicose veins of bilateral lower extremities with other complications: Secondary | ICD-10-CM | POA: Diagnosis not present

## 2018-08-03 DIAGNOSIS — I8311 Varicose veins of right lower extremity with inflammation: Secondary | ICD-10-CM | POA: Diagnosis not present

## 2018-08-04 DIAGNOSIS — I83893 Varicose veins of bilateral lower extremities with other complications: Secondary | ICD-10-CM | POA: Diagnosis not present

## 2018-08-04 DIAGNOSIS — I8312 Varicose veins of left lower extremity with inflammation: Secondary | ICD-10-CM | POA: Diagnosis not present

## 2018-08-04 DIAGNOSIS — I8311 Varicose veins of right lower extremity with inflammation: Secondary | ICD-10-CM | POA: Diagnosis not present

## 2018-08-09 DIAGNOSIS — I8312 Varicose veins of left lower extremity with inflammation: Secondary | ICD-10-CM | POA: Diagnosis not present

## 2018-08-09 DIAGNOSIS — I8311 Varicose veins of right lower extremity with inflammation: Secondary | ICD-10-CM | POA: Diagnosis not present

## 2018-08-09 DIAGNOSIS — I83893 Varicose veins of bilateral lower extremities with other complications: Secondary | ICD-10-CM | POA: Diagnosis not present

## 2018-08-09 DIAGNOSIS — I83813 Varicose veins of bilateral lower extremities with pain: Secondary | ICD-10-CM | POA: Diagnosis not present

## 2018-08-30 DIAGNOSIS — K640 First degree hemorrhoids: Secondary | ICD-10-CM | POA: Diagnosis not present

## 2018-08-30 DIAGNOSIS — D120 Benign neoplasm of cecum: Secondary | ICD-10-CM | POA: Diagnosis not present

## 2018-08-30 DIAGNOSIS — Z800 Family history of malignant neoplasm of digestive organs: Secondary | ICD-10-CM | POA: Diagnosis not present

## 2018-08-30 DIAGNOSIS — K5730 Diverticulosis of large intestine without perforation or abscess without bleeding: Secondary | ICD-10-CM | POA: Diagnosis not present

## 2018-08-30 DIAGNOSIS — D122 Benign neoplasm of ascending colon: Secondary | ICD-10-CM | POA: Diagnosis not present

## 2018-09-02 DIAGNOSIS — D122 Benign neoplasm of ascending colon: Secondary | ICD-10-CM | POA: Diagnosis not present

## 2018-09-02 DIAGNOSIS — D120 Benign neoplasm of cecum: Secondary | ICD-10-CM | POA: Diagnosis not present

## 2018-12-01 DIAGNOSIS — H524 Presbyopia: Secondary | ICD-10-CM | POA: Diagnosis not present

## 2018-12-01 DIAGNOSIS — H52223 Regular astigmatism, bilateral: Secondary | ICD-10-CM | POA: Diagnosis not present

## 2018-12-01 DIAGNOSIS — H5213 Myopia, bilateral: Secondary | ICD-10-CM | POA: Diagnosis not present

## 2018-12-15 ENCOUNTER — Other Ambulatory Visit: Payer: Self-pay

## 2018-12-15 ENCOUNTER — Other Ambulatory Visit: Payer: Self-pay | Admitting: Chiropractic Medicine

## 2018-12-15 ENCOUNTER — Ambulatory Visit
Admission: RE | Admit: 2018-12-15 | Discharge: 2018-12-15 | Disposition: A | Discharge status: Home / Self Care | Payer: Medicare Other | Source: Ambulatory Visit | Attending: Chiropractic Medicine | Admitting: Chiropractic Medicine

## 2018-12-15 DIAGNOSIS — M25511 Pain in right shoulder: Secondary | ICD-10-CM | POA: Diagnosis not present

## 2019-01-10 DIAGNOSIS — L821 Other seborrheic keratosis: Secondary | ICD-10-CM | POA: Diagnosis not present

## 2019-01-10 DIAGNOSIS — L570 Actinic keratosis: Secondary | ICD-10-CM | POA: Diagnosis not present

## 2019-01-10 DIAGNOSIS — D1801 Hemangioma of skin and subcutaneous tissue: Secondary | ICD-10-CM | POA: Diagnosis not present

## 2019-01-23 DIAGNOSIS — M1711 Unilateral primary osteoarthritis, right knee: Secondary | ICD-10-CM | POA: Diagnosis not present

## 2019-01-23 DIAGNOSIS — E782 Mixed hyperlipidemia: Secondary | ICD-10-CM | POA: Diagnosis not present

## 2019-01-23 DIAGNOSIS — N400 Enlarged prostate without lower urinary tract symptoms: Secondary | ICD-10-CM | POA: Diagnosis not present

## 2019-03-26 ENCOUNTER — Ambulatory Visit: Payer: Medicare Other

## 2019-03-30 ENCOUNTER — Ambulatory Visit: Payer: Medicare Other | Attending: Internal Medicine

## 2019-03-30 DIAGNOSIS — Z23 Encounter for immunization: Secondary | ICD-10-CM | POA: Insufficient documentation

## 2019-03-30 NOTE — Progress Notes (Signed)
   Covid-19 Vaccination Clinic  Name:  Brian Rush    MRN: [id number] DOB: 01-05-1946  03/30/2019  [name] was observed post Covid-19 immunization for 15 minutes without incidence. He was provided with Vaccine Information Sheet and instruction to access the V-Safe system.   [name] was instructed to call 911 with any severe reactions post vaccine: Marland Kitchen Difficulty breathing  . Swelling of your face and throat  . A fast heartbeat  . A bad rash all over your body  . Dizziness and weakness    Immunizations Administered    Name Date Dose VIS Date Route   Pfizer COVID-19 Vaccine 03/30/2019  8:56 AM 0.3 mL 02/03/2019 Intramuscular   Manufacturer: [place]   Lot: [id number]   [place]: [id number]

## 2019-04-04 DIAGNOSIS — M1711 Unilateral primary osteoarthritis, right knee: Secondary | ICD-10-CM | POA: Diagnosis not present

## 2019-04-04 DIAGNOSIS — E782 Mixed hyperlipidemia: Secondary | ICD-10-CM | POA: Diagnosis not present

## 2019-04-04 DIAGNOSIS — N400 Enlarged prostate without lower urinary tract symptoms: Secondary | ICD-10-CM | POA: Diagnosis not present

## 2019-04-06 ENCOUNTER — Ambulatory Visit: Payer: Medicare Other

## 2019-04-20 DIAGNOSIS — M1712 Unilateral primary osteoarthritis, left knee: Secondary | ICD-10-CM | POA: Diagnosis not present

## 2019-04-24 ENCOUNTER — Ambulatory Visit: Payer: Medicare Other | Attending: Internal Medicine

## 2019-04-24 DIAGNOSIS — Z23 Encounter for immunization: Secondary | ICD-10-CM | POA: Insufficient documentation

## 2019-04-24 NOTE — Progress Notes (Signed)
   Covid-19 Vaccination Clinic  Name:  Brian Rush    MRN: [id number] DOB: 1945/11/21  04/24/2019  [name] was observed post Covid-19 immunization for 15 minutes without incidence. He was provided with Vaccine Information Sheet and instruction to access the V-Safe system.   [name] was instructed to call 911 with any severe reactions post vaccine: Marland Kitchen Difficulty breathing  . Swelling of your face and throat  . A fast heartbeat  . A bad rash all over your body  . Dizziness and weakness    Immunizations Administered    Name Date Dose VIS Date Route   Pfizer COVID-19 Vaccine 04/24/2019  1:35 PM 0.3 mL 02/03/2019 Intramuscular   Manufacturer: [place]   Lot: [id number]   [place]: [id number]

## 2019-05-11 ENCOUNTER — Ambulatory Visit: Payer: Self-pay | Admitting: Physician Assistant

## 2019-05-11 DIAGNOSIS — M1712 Unilateral primary osteoarthritis, left knee: Secondary | ICD-10-CM | POA: Diagnosis not present

## 2019-05-11 NOTE — H&P (Signed)
TOTAL KNEE ADMISSION H&P  Patient is being admitted for left total knee arthroplasty.  Subjective:  Chief Complaint:left knee pain.  HPI: Brian Rush [name], 74 y.o. male, has a history of pain and functional disability in the left knee due to arthritis and has failed non-surgical conservative treatments for greater than 12 weeks to includeNSAID's and/or analgesics, corticosteriod injections, viscosupplementation injections and activity modification.  Onset of symptoms was gradual, starting 7 years ago with gradually worsening course since that time. The patient noted no past surgery on the left knee(s).  Patient currently rates pain in the left knee(s) at 8 out of 10 with activity. Patient has night pain, worsening of pain with activity and weight bearing, pain that interferes with activities of daily living, pain with passive range of motion, crepitus and joint swelling.  Patient has evidence of periarticular osteophytes and joint space narrowing by imaging studies.  There is no active infection.  There are no problems to display for this patient.  No past medical history on file.  No past surgical history on file.  Current Outpatient Medications  Medication Sig Dispense Refill Last Dose  . co-enzyme Q-10 30 MG capsule Take 100 mg by mouth 3 (three) times daily.     Marland Kitchen glucosamine-chondroitin 500-400 MG tablet Take 1 tablet by mouth 3 (three) times daily.     . Multiple Vitamin (MULTIVITAMIN WITH MINERALS) TABS tablet Take 1 tablet by mouth daily.     . rosuvastatin (CRESTOR) 10 MG tablet Take 10 mg by mouth daily.     . sildenafil (VIAGRA) 25 MG tablet Take 50 mg by mouth daily as needed for erectile dysfunction.      No current facility-administered medications for this visit.   No Known Allergies  Social History   Tobacco Use  . Smoking status: Never Smoker  . Smokeless tobacco: Never Used  Substance Use Topics  . Alcohol use: Not on file    No family history on file.   Review  of Systems  HENT: Positive for hearing loss and nosebleeds.   Cardiovascular: Positive for leg swelling.  Musculoskeletal: Positive for arthralgias and joint swelling.  Neurological: Positive for headaches.  All other systems reviewed and are negative.   Objective:  Physical Exam  Constitutional: He is oriented to person, place, and time. He appears well-developed and well-nourished. No distress.  HENT:  Head: Normocephalic and atraumatic.  Eyes: Pupils are equal, round, and reactive to light. Conjunctivae and EOM are normal.  Cardiovascular: Normal rate, regular rhythm, normal heart sounds and intact distal pulses.  Respiratory: Effort normal and breath sounds normal. No respiratory distress. He has no wheezes.  GI: Soft. Bowel sounds are normal. He exhibits no distension. There is no abdominal tenderness.  Musculoskeletal:     Cervical back: Normal range of motion and neck supple.     Left knee: Swelling and bony tenderness present. No effusion or erythema. Normal range of motion. Tenderness present.  Lymphadenopathy:    He has no cervical adenopathy.  Neurological: He is alert and oriented to person, place, and time.  Skin: Skin is warm and dry. No rash noted. No erythema.  Psychiatric: He has a normal mood and affect. His behavior is normal.    Vital signs in last 24 hours: @VSRANGES @  Labs:   There is no height or weight on file to calculate BMI.   Imaging Review Plain radiographs demonstrate moderate degenerative joint disease of the left knee(s). The overall alignment ismild valgus. The bone  quality appears to be good for age and reported activity level.      Assessment/Plan:  End stage arthritis, left knee   The patient history, physical examination, clinical judgment of the provider and imaging studies are consistent with end stage degenerative joint disease of the left knee(s) and total knee arthroplasty is deemed medically necessary. The treatment options  including medical management, injection therapy arthroscopy and arthroplasty were discussed at length. The risks and benefits of total knee arthroplasty were presented and reviewed. The risks due to aseptic loosening, infection, stiffness, patella tracking problems, thromboembolic complications and other imponderables were discussed. The patient acknowledged the explanation, agreed to proceed with the plan and consent was signed. Patient is being admitted for inpatient treatment for surgery, pain control, PT, OT, prophylactic antibiotics, VTE prophylaxis, progressive ambulation and ADL's and discharge planning. The patient is planning to be discharged home with home health services    Anticipated LOS equal to or greater than 2 midnights due to - Age 79 and older with one or more of the following:  - Obesity  - Expected need for hospital services (PT, OT, Nursing) required for safe  discharge  - Anticipated need for postoperative skilled nursing care or inpatient rehab  - Active co-morbidities: None OR   - Unanticipated findings during/Post Surgery: None  - Patient is a high risk of re-admission due to: None

## 2019-05-11 NOTE — H&P (View-Only) (Signed)
TOTAL KNEE ADMISSION H&P  Patient is being admitted for left total knee arthroplasty.  Subjective:  Chief Complaint:left knee pain.  HPI: Brian Rush [facility], 74 y.o. male, has a history of pain and functional disability in the left knee due to arthritis and has failed non-surgical conservative treatments for greater than 12 weeks to includeNSAID's and/or analgesics, corticosteriod injections, viscosupplementation injections and activity modification.  Onset of symptoms was gradual, starting 7 years ago with gradually worsening course since that time. The patient noted no past surgery on the left knee(s).  Patient currently rates pain in the left knee(s) at 8 out of 10 with activity. Patient has night pain, worsening of pain with activity and weight bearing, pain that interferes with activities of daily living, pain with passive range of motion, crepitus and joint swelling.  Patient has evidence of periarticular osteophytes and joint space narrowing by imaging studies.  There is no active infection.  There are no problems to display for this patient.  No past medical history on file.  No past surgical history on file.  Current Outpatient Medications  Medication Sig Dispense Refill Last Dose  . co-enzyme Q-10 30 MG capsule Take 100 mg by mouth 3 (three) times daily.     Marland Kitchen glucosamine-chondroitin 500-400 MG tablet Take 1 tablet by mouth 3 (three) times daily.     . Multiple Vitamin (MULTIVITAMIN WITH MINERALS) TABS tablet Take 1 tablet by mouth daily.     . rosuvastatin (CRESTOR) 10 MG tablet Take 10 mg by mouth daily.     . sildenafil (VIAGRA) 25 MG tablet Take 50 mg by mouth daily as needed for erectile dysfunction.      No current facility-administered medications for this visit.   No Known Allergies  Social History   Tobacco Use  . Smoking status: Never Smoker  . Smokeless tobacco: Never Used  Substance Use Topics  . Alcohol use: Not on file    No family history on file.   Review  of Systems  HENT: Positive for hearing loss and nosebleeds.   Cardiovascular: Positive for leg swelling.  Musculoskeletal: Positive for arthralgias and joint swelling.  Neurological: Positive for headaches.  All other systems reviewed and are negative.   Objective:  Physical Exam  Constitutional: He is oriented to person, place, and time. He appears well-developed and well-nourished. No distress.  HENT:  Head: Normocephalic and atraumatic.  Eyes: Pupils are equal, round, and reactive to light. Conjunctivae and EOM are normal.  Cardiovascular: Normal rate, regular rhythm, normal heart sounds and intact distal pulses.  Respiratory: Effort normal and breath sounds normal. No respiratory distress. He has no wheezes.  GI: Soft. Bowel sounds are normal. He exhibits no distension. There is no abdominal tenderness.  Musculoskeletal:     Cervical back: Normal range of motion and neck supple.     Left knee: Swelling and bony tenderness present. No effusion or erythema. Normal range of motion. Tenderness present.  Lymphadenopathy:    He has no cervical adenopathy.  Neurological: He is alert and oriented to person, place, and time.  Skin: Skin is warm and dry. No rash noted. No erythema.  Psychiatric: He has a normal mood and affect. His behavior is normal.    Vital signs in last 24 hours: @VSRANGES @  Labs:   There is no height or weight on file to calculate BMI.   Imaging Review Plain radiographs demonstrate moderate degenerative joint disease of the left knee(s). The overall alignment ismild valgus. The bone  quality appears to be good for age and reported activity level.      Assessment/Plan:  End stage arthritis, left knee   The patient history, physical examination, clinical judgment of the provider and imaging studies are consistent with end stage degenerative joint disease of the left knee(s) and total knee arthroplasty is deemed medically necessary. The treatment options  including medical management, injection therapy arthroscopy and arthroplasty were discussed at length. The risks and benefits of total knee arthroplasty were presented and reviewed. The risks due to aseptic loosening, infection, stiffness, patella tracking problems, thromboembolic complications and other imponderables were discussed. The patient acknowledged the explanation, agreed to proceed with the plan and consent was signed. Patient is being admitted for inpatient treatment for surgery, pain control, PT, OT, prophylactic antibiotics, VTE prophylaxis, progressive ambulation and ADL's and discharge planning. The patient is planning to be discharged home with home health services    Anticipated LOS equal to or greater than 2 midnights due to - Age 25 and older with one or more of the following:  - Obesity  - Expected need for hospital services (PT, OT, Nursing) required for safe  discharge  - Anticipated need for postoperative skilled nursing care or inpatient rehab  - Active co-morbidities: None OR   - Unanticipated findings during/Post Surgery: None  - Patient is a high risk of re-admission due to: None

## 2019-05-18 NOTE — Progress Notes (Addendum)
PCP - Quran Moreen Fowler w/ surgical and cardiac clearance on chart. Cardiologist -   Chest x-ray -  EKG -  Stress Test -  ECHO -  Cardiac Cath -   Sleep Study -  CPAP -   Fasting Blood Sugar -  Checks Blood Sugar _____ times a day  Blood Thinner Instructions: Aspirin Instructions: Last Dose:  Anesthesia review:   Patient denies shortness of breath, fever, cough and chest pain at PAT appointment   Patient verbalized understanding of instructions that were given to them at the PAT appointment. Patient was also instructed that they will need to review over the PAT instructions again at home before surgery.

## 2019-05-18 NOTE — Patient Instructions (Addendum)
DUE TO COVID-19 ONLY ONE VISITOR IS ALLOWED TO COME WITH YOU AND STAY IN THE WAITING ROOM ONLY DURING PRE OP AND PROCEDURE DAY OF SURGERY. THE 1 VISITOR MAY VISIT WITH YOU AFTER SURGERY IN YOUR PRIVATE ROOM DURING VISITING HOURS ONLY!  YOU NEED TO HAVE A COVID 19 TEST ON 05-30-19 @ 3:00 PM, THIS TEST MUST BE DONE BEFORE SURGERY, COME  [place], [place] , [address].  ([place]) ONCE YOUR COVID TEST IS COMPLETED, PLEASE BEGIN THE QUARANTINE INSTRUCTIONS AS OUTLINED IN YOUR HANDOUT.                Brian Rush [facility]  05/18/2019   Your procedure is scheduled on: 06-02-19   Report to [facility] Main  Entrance    Report to Short Stay  at 5:30 AM     Call this number if you have problems the morning of surgery [phone]    Remember: NO SOLID FOOD AFTER MIDNIGHT THE NIGHT PRIOR TO SURGERY. NOTHING BY MOUTH EXCEPT CLEAR LIQUIDS UNTIL 4:30 AM . PLEASE FINISH ENSURE DRINK PER SURGEON ORDER  WHICH NEEDS TO BE COMPLETED AT 4:30 AM .   CLEAR LIQUID DIET   Foods Allowed                                                                     Foods Excluded  Coffee and tea, regular and decaf                             liquids that you cannot  Plain Jell-O any favor except red or purple                                           see through such as: Fruit ices (not with fruit pulp)                                     milk, soups, orange juice  Iced Popsicles                                    All solid food Carbonated beverages, regular and diet                                    Cranberry, grape and apple juices Sports drinks like Gatorade Lightly seasoned clear broth or consume(fat free) Sugar, honey syrup                                                  _____________________________________________________________________       Take these medicines the morning of surgery with A SIP OF WATER: Claritin. You may also use your nasal spray and eyedrops.  BRUSH YOUR TEETH MORNING OF SURGERY AND RINSE YOUR MOUTH OUT, NO CHEWING GUM CANDY OR MINTS.                                 You may not have any metal on your body including hair pins and              piercings      Do not wear jewelry, cologne, lotions, powders or deodorant              Men may shave face and neck.   Do not bring valuables to the hospital. [place].  Contacts, dentures or bridgework may not be worn into surgery.  You may bring an overnight bag    Special Instructions: N/A              Please read over the following fact sheets you were given: _____________________________________________________________________             [facility] - Preparing for Surgery Before surgery, you can play an important role.  Because skin is not sterile, your skin needs to be as free of germs as possible.  You can reduce the number of germs on your skin by washing with CHG (chlorahexidine gluconate) soap before surgery.  CHG is an antiseptic cleaner which kills germs and bonds with the skin to continue killing germs even after washing. Please DO NOT use if you have an allergy to CHG or antibacterial soaps.  If your skin becomes reddened/irritated stop using the CHG and inform your nurse when you arrive at Short Stay. Do not shave (including legs and underarms) for at least 48 hours prior to the first CHG shower.  You may shave your face/neck. Please follow these instructions carefully:  1.  Shower with CHG Soap the night before surgery and the  morning of Surgery.  2.  If you choose to wash your hair, wash your hair first as usual with your  normal  shampoo.  3.  After you shampoo, rinse your hair and body thoroughly to remove the  shampoo.                           4.  Use CHG as you would any other liquid soap.  You can apply chg directly  to the skin and wash                       Gently with a scrungie or clean washcloth.  5.   Apply the CHG Soap to your body ONLY FROM THE NECK DOWN.   Do not use on face/ open                           Wound or open sores. Avoid contact with eyes, ears mouth and genitals (private parts).                       Wash face,  Genitals (private parts) with your normal soap.             6.  Wash thoroughly, paying special attention to the area where your surgery  will be performed.  7.  Thoroughly rinse your body  with warm water from the neck down.  8.  DO NOT shower/wash with your normal soap after using and rinsing off  the CHG Soap.                9.  Pat yourself dry with a clean towel.            10.  Wear clean pajamas.            11.  Place clean sheets on your bed the night of your first shower and do not  sleep with pets. Day of Surgery : Do not apply any lotions/deodorants the morning of surgery.  Please wear clean clothes to the hospital/surgery center.  FAILURE TO FOLLOW THESE INSTRUCTIONS MAY RESULT IN THE CANCELLATION OF YOUR SURGERY PATIENT SIGNATURE_________________________________  NURSE SIGNATURE__________________________________  ________________________________________________________________________   Brian Rush  An incentive spirometer is a tool that can help keep your lungs clear and active. This tool measures how well you are filling your lungs with each breath. Taking long deep breaths may help reverse or decrease the chance of developing breathing (pulmonary) problems (especially infection) following:  A long period of time when you are unable to move or be active. BEFORE THE PROCEDURE   If the spirometer includes an indicator to show your best effort, your nurse or respiratory therapist will set it to a desired goal.  If possible, sit up straight or lean slightly forward. Try not to slouch.  Hold the incentive spirometer in an upright position. INSTRUCTIONS FOR USE  1. Sit on the edge of your bed if possible, or sit up as far as you can in bed  or on a chair. 2. Hold the incentive spirometer in an upright position. 3. Breathe out normally. 4. Place the mouthpiece in your mouth and seal your lips tightly around it. 5. Breathe in slowly and as deeply as possible, raising the piston or the ball toward the top of the column. 6. Hold your breath for 3-5 seconds or for as long as possible. Allow the piston or ball to fall to the bottom of the column. 7. Remove the mouthpiece from your mouth and breathe out normally. 8. Rest for a few seconds and repeat Steps 1 through 7 at least 10 times every 1-2 hours when you are awake. Take your time and take a few normal breaths between deep breaths. 9. The spirometer may include an indicator to show your best effort. Use the indicator as a goal to work toward during each repetition. 10. After each set of 10 deep breaths, practice coughing to be sure your lungs are clear. If you have an incision (the cut made at the time of surgery), support your incision when coughing by placing a pillow or rolled up towels firmly against it. Once you are able to get out of bed, walk around indoors and cough well. You may stop using the incentive spirometer when instructed by your caregiver.  RISKS AND COMPLICATIONS  Take your time so you do not get dizzy or light-headed.  If you are in pain, you may need to take or ask for pain medication before doing incentive spirometry. It is harder to take a deep breath if you are having pain. AFTER USE  Rest and breathe slowly and easily.  It can be helpful to keep track of a log of your progress. Your caregiver can provide you with a simple table to help with this. If you are using the spirometer at home, follow these  instructions: SEEK MEDICAL CARE IF:   You are having difficultly using the spirometer.  You have trouble using the spirometer as often as instructed.  Your pain medication is not giving enough relief while using the spirometer.  You develop fever of 100.5  F (38.1 C) or higher. SEEK IMMEDIATE MEDICAL CARE IF:   You cough up bloody sputum that had not been present before.  You develop fever of 102 F (38.9 C) or greater.  You develop worsening pain at or near the incision site. MAKE SURE YOU:   Understand these instructions.  Will watch your condition.  Will get help right away if you are not doing well or get worse. Document Released: 06/22/2006 Document Revised: 05/04/2011 Document Reviewed: 08/23/2006 ExitCare Patient Information 2014 ExitCare, [place].   ________________________________________________________________________  WHAT IS A BLOOD TRANSFUSION? Blood Transfusion Information  A transfusion is the replacement of blood or some of its parts. Blood is made up of multiple cells which provide different functions.  Red blood cells carry oxygen and are used for blood loss replacement.  White blood cells fight against infection.  Platelets control bleeding.  Plasma helps clot blood.  Other blood products are available for specialized needs, such as hemophilia or other clotting disorders. BEFORE THE TRANSFUSION  Who gives blood for transfusions?   Healthy volunteers who are fully evaluated to make sure their blood is safe. This is blood bank blood. Transfusion therapy is the safest it has ever been in the practice of medicine. Before blood is taken from a donor, a complete history is taken to make sure that person has no history of diseases nor engages in risky social behavior (examples are intravenous drug use or sexual activity with multiple partners). The donor's travel history is screened to minimize risk of transmitting infections, such as malaria. The donated blood is tested for signs of infectious diseases, such as HIV and hepatitis. The blood is then tested to be sure it is compatible with you in order to minimize the chance of a transfusion reaction. If you or a relative donates blood, this is often done in  anticipation of surgery and is not appropriate for emergency situations. It takes many days to process the donated blood. RISKS AND COMPLICATIONS Although transfusion therapy is very safe and saves many lives, the main dangers of transfusion include:   Getting an infectious disease.  Developing a transfusion reaction. This is an allergic reaction to something in the blood you were given. Every precaution is taken to prevent this. The decision to have a blood transfusion has been considered carefully by your caregiver before blood is given. Blood is not given unless the benefits outweigh the risks. AFTER THE TRANSFUSION  Right after receiving a blood transfusion, you will usually feel much better and more energetic. This is especially true if your red blood cells have gotten low (anemic). The transfusion raises the level of the red blood cells which carry oxygen, and this usually causes an energy increase.  The nurse administering the transfusion will monitor you carefully for complications. HOME CARE INSTRUCTIONS  No special instructions are needed after a transfusion. You may find your energy is better. Speak with your caregiver about any limitations on activity for underlying diseases you may have. SEEK MEDICAL CARE IF:   Your condition is not improving after your transfusion.  You develop redness or irritation at the intravenous (IV) site. SEEK IMMEDIATE MEDICAL CARE IF:  Any of the following symptoms occur over the next 12 hours:  Shaking chills.  You have a temperature by mouth above 102 F (38.9 C), not controlled by medicine.  Chest, back, or muscle pain.  People around you feel you are not acting correctly or are confused.  Shortness of breath or difficulty breathing.  Dizziness and fainting.  You get a rash or develop hives.  You have a decrease in urine output.  Your urine turns a dark color or changes to pink, red, or brown. Any of the following symptoms occur over  the next 10 days:  You have a temperature by mouth above 102 F (38.9 C), not controlled by medicine.  Shortness of breath.  Weakness after normal activity.  The white part of the eye turns yellow (jaundice).  You have a decrease in the amount of urine or are urinating less often.  Your urine turns a dark color or changes to pink, red, or brown. Document Released: 02/07/2000 Document Revised: 05/04/2011 Document Reviewed: 09/26/2007 [facility] Patient Information 2014 [place], [place].  _______________________________________________________________________

## 2019-05-19 NOTE — Care Plan (Signed)
Ortho Bundle Case Management Note  Patient Details  Name: Brian Rush MRN: [id number] Date of Birth: 1946-01-27   spoke with patient prior to surgery. He will discharge to home with family to assist. Rolling walker and CPM ordered. HHPT referral to [facility]. OPPT set up at [facility].  Patient and MD in agreement with plan. Choice offered.                   DME Arranged:  Gilford Rile rolling, CPM DME Agency:  [employer]  HH Arranged:  PT [place] Agency:  [facility]  Additional Comments: Please contact me with any questions of if this plan should need to change.  Ladell Heads,  [place] [facility]  [phone] 05/19/2019, 12:25 PM

## 2019-05-22 ENCOUNTER — Other Ambulatory Visit: Payer: Self-pay

## 2019-05-22 ENCOUNTER — Encounter (HOSPITAL_COMMUNITY)
Admission: RE | Admit: 2019-05-22 | Discharge: 2019-05-22 | Disposition: A | Discharge status: Home / Self Care | Payer: Medicare Other | Source: Ambulatory Visit | Attending: Orthopedic Surgery | Admitting: Orthopedic Surgery

## 2019-05-22 ENCOUNTER — Encounter (HOSPITAL_COMMUNITY): Payer: Self-pay

## 2019-05-22 DIAGNOSIS — Z01812 Encounter for preprocedural laboratory examination: Secondary | ICD-10-CM | POA: Diagnosis not present

## 2019-05-22 HISTORY — DX: Unspecified osteoarthritis, unspecified site: M19.90

## 2019-05-22 HISTORY — DX: Gastro-esophageal reflux disease without esophagitis: K21.9

## 2019-05-22 HISTORY — DX: Headache, unspecified: R51.9

## 2019-05-22 HISTORY — DX: Allergy, unspecified, initial encounter: T78.40XA

## 2019-05-22 HISTORY — DX: Pre-excitation syndrome: I45.6

## 2019-05-22 LAB — CBC WITH DIFFERENTIAL/PLATELET
Abs Immature Granulocytes: 0.02 10*3/uL (ref 0.00–0.07)
Basophils Absolute: 0.1 10*3/uL (ref 0.0–0.1)
Basophils Relative: 1 %
Eosinophils Absolute: 0.2 10*3/uL (ref 0.0–0.5)
Eosinophils Relative: 2 %
HCT: 44.2 % (ref 39.0–52.0)
Hemoglobin: 14.5 g/dL (ref 13.0–17.0)
Immature Granulocytes: 0 %
Lymphocytes Relative: 22 %
Lymphs Abs: 1.5 10*3/uL (ref 0.7–4.0)
MCH: 32.5 pg (ref 26.0–34.0)
MCHC: 32.8 g/dL (ref 30.0–36.0)
MCV: 99.1 fL (ref 80.0–100.0)
Monocytes Absolute: 0.7 10*3/uL (ref 0.1–1.0)
Monocytes Relative: 10 %
Neutro Abs: 4.5 10*3/uL (ref 1.7–7.7)
Neutrophils Relative %: 65 %
Platelets: 205 10*3/uL (ref 150–400)
RBC: 4.46 MIL/uL (ref 4.22–5.81)
RDW: 12.6 % (ref 11.5–15.5)
WBC: 6.9 10*3/uL (ref 4.0–10.5)
nRBC: 0 % (ref 0.0–0.2)

## 2019-05-22 LAB — TYPE AND SCREEN
ABO/RH(D): A POS
Antibody Screen: NEGATIVE

## 2019-05-22 LAB — COMPREHENSIVE METABOLIC PANEL
ALT: 26 U/L (ref 0–44)
AST: 28 U/L (ref 15–41)
Albumin: 3.8 g/dL (ref 3.5–5.0)
Alkaline Phosphatase: 45 U/L (ref 38–126)
Anion gap: 6 (ref 5–15)
BUN: 19 mg/dL (ref 8–23)
CO2: 28 mmol/L (ref 22–32)
Calcium: 9.2 mg/dL (ref 8.9–10.3)
Chloride: 108 mmol/L (ref 98–111)
Creatinine, Ser: 0.81 mg/dL (ref 0.61–1.24)
GFR calc Af Amer: >60 mL/min (ref >60)
GFR calc non Af Amer: >60 mL/min (ref >60)
Glucose, Bld: 104 mg/dL — ABNORMAL HIGH (ref 70–99)
Potassium: 4.7 mmol/L (ref 3.5–5.1)
Sodium: 142 mmol/L (ref 135–145)
Total Bilirubin: 0.8 mg/dL (ref 0.3–1.2)
Total Protein: 6.5 g/dL (ref 6.5–8.1)

## 2019-05-22 LAB — ABO/RH: ABO/RH(D): A POS

## 2019-05-22 LAB — SURGICAL PCR SCREEN
MRSA, PCR: NEGATIVE
Staphylococcus aureus: NEGATIVE

## 2019-05-22 LAB — APTT: aPTT: 28 seconds (ref 24–36)

## 2019-05-22 LAB — PROTIME-INR
INR: 1.1 (ref 0.8–1.2)
Prothrombin Time: 13.6 seconds (ref 11.4–15.2)

## 2019-05-30 ENCOUNTER — Other Ambulatory Visit (HOSPITAL_COMMUNITY)
Admission: RE | Admit: 2019-05-30 | Discharge: 2019-05-30 | Disposition: A | Discharge status: Home / Self Care | Payer: Medicare Other | Source: Ambulatory Visit | Attending: Orthopedic Surgery | Admitting: Orthopedic Surgery

## 2019-05-30 DIAGNOSIS — Z20822 Contact with and (suspected) exposure to covid-19: Secondary | ICD-10-CM | POA: Diagnosis not present

## 2019-05-30 DIAGNOSIS — Z01812 Encounter for preprocedural laboratory examination: Secondary | ICD-10-CM | POA: Insufficient documentation

## 2019-05-30 LAB — SARS CORONAVIRUS 2 (TAT 6-24 HRS): SARS Coronavirus 2: NEGATIVE

## 2019-06-01 MED ORDER — TRANEXAMIC ACID 1000 MG/10ML IV SOLN
2000.0000 mg | INTRAVENOUS | Status: DC
  Filled 2019-06-01: qty 20

## 2019-06-01 MED ORDER — BUPIVACAINE LIPOSOME 1.3 % IJ SUSP
20.0000 mL | Freq: Once | INTRAMUSCULAR | Status: DC
  Filled 2019-06-01: qty 20

## 2019-06-01 NOTE — Anesthesia Preprocedure Evaluation (Addendum)
Anesthesia Evaluation  Patient identified by MRN, date of birth, ID band Patient awake    Reviewed: Allergy & Precautions, NPO status , Patient's Chart, lab work & pertinent test results  History of Anesthesia Complications Negative for: history of anesthetic complications  Airway Mallampati: II  TM Distance: >3 FB     Dental no notable dental hx. (+) Caps, Dental Advisory Given   Pulmonary neg pulmonary ROS,    Pulmonary exam normal        Cardiovascular negative cardio ROS Normal cardiovascular exam     Neuro/Psych  Headaches,    GI/Hepatic Neg liver ROS, GERD  ,  Endo/Other  negative endocrine ROS  Renal/GU negative Renal ROS     Musculoskeletal  (+) Arthritis ,   Abdominal   Peds  Hematology negative hematology ROS (+)   Anesthesia Other Findings Day of surgery medications reviewed with the patient.  Reproductive/Obstetrics                            Anesthesia Physical Anesthesia Plan  ASA: II  Anesthesia Plan: Spinal   Post-op Pain Management:  Regional for Post-op pain   Induction:   PONV Risk Score and Plan: 2 and Ondansetron and Propofol infusion  Airway Management Planned: Natural Airway, Simple Face Mask and Nasal Cannula  Additional Equipment:   Intra-op Plan:   Post-operative Plan:   Informed Consent: I have reviewed the patients History and Physical, chart, labs and discussed the procedure including the risks, benefits and alternatives for the proposed anesthesia with the patient or authorized representative who has indicated his/her understanding and acceptance.     Dental advisory given  Plan Discussed with: Anesthesiologist and CRNA  Anesthesia Plan Comments:        Anesthesia Quick Evaluation

## 2019-06-02 ENCOUNTER — Ambulatory Visit (HOSPITAL_COMMUNITY): Payer: Medicare Other | Admitting: Certified Registered Nurse Anesthetist

## 2019-06-02 ENCOUNTER — Other Ambulatory Visit: Payer: Self-pay

## 2019-06-02 ENCOUNTER — Encounter (HOSPITAL_COMMUNITY)
Admission: RE | Disposition: A | Discharge status: Home / Self Care | Payer: Self-pay | Source: Home / Self Care | Attending: Orthopedic Surgery

## 2019-06-02 ENCOUNTER — Encounter (HOSPITAL_COMMUNITY): Payer: Self-pay | Admitting: Orthopedic Surgery

## 2019-06-02 ENCOUNTER — Ambulatory Visit (HOSPITAL_COMMUNITY)
Admission: RE | Admit: 2019-06-02 | Discharge: 2019-06-03 | Disposition: A | Discharge status: Home / Self Care | Payer: Medicare Other | Attending: Orthopedic Surgery | Admitting: Orthopedic Surgery

## 2019-06-02 DIAGNOSIS — K219 Gastro-esophageal reflux disease without esophagitis: Secondary | ICD-10-CM | POA: Diagnosis not present

## 2019-06-02 DIAGNOSIS — M1712 Unilateral primary osteoarthritis, left knee: Secondary | ICD-10-CM | POA: Insufficient documentation

## 2019-06-02 DIAGNOSIS — Z79899 Other long term (current) drug therapy: Secondary | ICD-10-CM | POA: Diagnosis not present

## 2019-06-02 DIAGNOSIS — G8918 Other acute postprocedural pain: Secondary | ICD-10-CM | POA: Diagnosis not present

## 2019-06-02 DIAGNOSIS — R519 Headache, unspecified: Secondary | ICD-10-CM | POA: Diagnosis not present

## 2019-06-02 HISTORY — PX: TOTAL KNEE ARTHROPLASTY: SHX125

## 2019-06-02 SURGERY — ARTHROPLASTY, KNEE, TOTAL
Anesthesia: Spinal | Site: Knee | Laterality: Left

## 2019-06-02 MED ORDER — PHENYLEPHRINE 40 MCG/ML (10ML) SYRINGE FOR IV PUSH (FOR BLOOD PRESSURE SUPPORT)
PREFILLED_SYRINGE | INTRAVENOUS | Status: DC | PRN
  Administered 2019-06-02 (×2): 80 ug via INTRAVENOUS
  Administered 2019-06-02: 40 ug via INTRAVENOUS

## 2019-06-02 MED ORDER — ACETAMINOPHEN 500 MG PO TABS
1000.0000 mg | ORAL_TABLET | Freq: Four times a day (QID) | ORAL | Status: AC
  Administered 2019-06-02 – 2019-06-03 (×3): 1000 mg via ORAL
  Filled 2019-06-02 (×3): qty 2

## 2019-06-02 MED ORDER — FENTANYL CITRATE (PF) 100 MCG/2ML IJ SOLN
INTRAMUSCULAR | Status: DC | PRN
  Administered 2019-06-02: 50 ug via INTRAVENOUS

## 2019-06-02 MED ORDER — SODIUM CHLORIDE 0.9% FLUSH
INTRAVENOUS | Status: DC | PRN
  Administered 2019-06-02: 50 mL

## 2019-06-02 MED ORDER — SODIUM CHLORIDE (PF) 0.9 % IJ SOLN
INTRAMUSCULAR | Status: AC
  Filled 2019-06-02: qty 50

## 2019-06-02 MED ORDER — METOCLOPRAMIDE HCL 5 MG/ML IJ SOLN: 5.0000 mg | Freq: Three times a day (TID) | INTRAMUSCULAR | Status: DC | PRN

## 2019-06-02 MED ORDER — CEFAZOLIN SODIUM-DEXTROSE 2-4 GM/100ML-% IV SOLN
2.0000 g | INTRAVENOUS | Status: AC
  Administered 2019-06-02: 2 g via INTRAVENOUS
  Filled 2019-06-02: qty 100

## 2019-06-02 MED ORDER — PROPOFOL 500 MG/50ML IV EMUL
INTRAVENOUS | Status: DC | PRN
  Administered 2019-06-02: 50 ug/kg/min via INTRAVENOUS

## 2019-06-02 MED ORDER — ONDANSETRON HCL 4 MG/2ML IJ SOLN
INTRAMUSCULAR | Status: DC | PRN
  Administered 2019-06-02: 4 mg via INTRAVENOUS

## 2019-06-02 MED ORDER — PHENOL 1.4 % MT LIQD: 1.0000 | OROMUCOSAL | Status: DC | PRN

## 2019-06-02 MED ORDER — GABAPENTIN 300 MG PO CAPS
300.0000 mg | ORAL_CAPSULE | Freq: Three times a day (TID) | ORAL | Status: DC
  Administered 2019-06-02 – 2019-06-03 (×3): 300 mg via ORAL
  Filled 2019-06-02 (×3): qty 1

## 2019-06-02 MED ORDER — CELECOXIB 200 MG PO CAPS
200.0000 mg | ORAL_CAPSULE | Freq: Two times a day (BID) | ORAL | Status: DC
  Administered 2019-06-02 – 2019-06-03 (×2): 200 mg via ORAL
  Filled 2019-06-02 (×2): qty 1

## 2019-06-02 MED ORDER — BUPIVACAINE LIPOSOME 1.3 % IJ SUSP
INTRAMUSCULAR | Status: DC | PRN
  Administered 2019-06-02: 20 mL

## 2019-06-02 MED ORDER — ONDANSETRON HCL 4 MG PO TABS: 4.0000 mg | ORAL_TABLET | Freq: Four times a day (QID) | ORAL | Status: DC | PRN

## 2019-06-02 MED ORDER — SODIUM CHLORIDE 0.9 % IR SOLN
Status: DC | PRN
  Administered 2019-06-02: 1

## 2019-06-02 MED ORDER — FENTANYL CITRATE (PF) 100 MCG/2ML IJ SOLN
25.0000 ug | INTRAMUSCULAR | Status: DC | PRN
  Administered 2019-06-02: 50 ug via INTRAVENOUS

## 2019-06-02 MED ORDER — CHLORHEXIDINE GLUCONATE 4 % EX LIQD: 60.0000 mL | Freq: Once | CUTANEOUS | Status: DC

## 2019-06-02 MED ORDER — PROPOFOL 10 MG/ML IV BOLUS
INTRAVENOUS | Status: AC
  Filled 2019-06-02: qty 20

## 2019-06-02 MED ORDER — GABAPENTIN 300 MG PO CAPS: ORAL_CAPSULE | ORAL | 0 refills | Status: DC

## 2019-06-02 MED ORDER — ACETAMINOPHEN 325 MG PO TABS: 650.0000 mg | ORAL_TABLET | ORAL | 2 refills | Status: AC | PRN

## 2019-06-02 MED ORDER — ASPIRIN EC 81 MG PO TBEC: 81.0000 mg | DELAYED_RELEASE_TABLET | Freq: Two times a day (BID) | ORAL | 0 refills | Status: AC

## 2019-06-02 MED ORDER — ACETAMINOPHEN 325 MG PO TABS: 325.0000 mg | ORAL_TABLET | Freq: Four times a day (QID) | ORAL | Status: DC | PRN

## 2019-06-02 MED ORDER — LORATADINE 10 MG PO TABS: 10.0000 mg | ORAL_TABLET | Freq: Every day | ORAL | Status: DC | PRN

## 2019-06-02 MED ORDER — SODIUM CHLORIDE 0.9 % IV SOLN: INTRAVENOUS | Status: DC

## 2019-06-02 MED ORDER — METOCLOPRAMIDE HCL 5 MG PO TABS: 5.0000 mg | ORAL_TABLET | Freq: Three times a day (TID) | ORAL | Status: DC | PRN

## 2019-06-02 MED ORDER — HYDROMORPHONE HCL 1 MG/ML IJ SOLN: 0.5000 mg | INTRAMUSCULAR | Status: DC | PRN

## 2019-06-02 MED ORDER — MENTHOL 3 MG MT LOZG: 1.0000 | LOZENGE | OROMUCOSAL | Status: DC | PRN

## 2019-06-02 MED ORDER — DOCUSATE SODIUM 100 MG PO CAPS: 100.0000 mg | ORAL_CAPSULE | Freq: Every day | ORAL | 2 refills | Status: AC | PRN

## 2019-06-02 MED ORDER — TRANEXAMIC ACID-NACL 1000-0.7 MG/100ML-% IV SOLN
1000.0000 mg | INTRAVENOUS | Status: AC
  Administered 2019-06-02: 1000 mg via INTRAVENOUS
  Filled 2019-06-02: qty 100

## 2019-06-02 MED ORDER — FENTANYL CITRATE (PF) 100 MCG/2ML IJ SOLN
INTRAMUSCULAR | Status: AC
  Filled 2019-06-02: qty 2

## 2019-06-02 MED ORDER — CELECOXIB 200 MG PO CAPS: 200.0000 mg | ORAL_CAPSULE | Freq: Two times a day (BID) | ORAL | 0 refills | Status: AC

## 2019-06-02 MED ORDER — DIPHENHYDRAMINE HCL 12.5 MG/5ML PO ELIX: 12.5000 mg | ORAL_SOLUTION | ORAL | Status: DC | PRN

## 2019-06-02 MED ORDER — CELECOXIB 200 MG PO CAPS
200.0000 mg | ORAL_CAPSULE | Freq: Once | ORAL | Status: AC
  Administered 2019-06-02: 200 mg via ORAL
  Filled 2019-06-02: qty 1

## 2019-06-02 MED ORDER — OXYCODONE HCL 5 MG PO TABS: ORAL_TABLET | ORAL | 0 refills | Status: DC

## 2019-06-02 MED ORDER — PROPOFOL 500 MG/50ML IV EMUL
INTRAVENOUS | Status: AC
  Filled 2019-06-02: qty 50

## 2019-06-02 MED ORDER — DOCUSATE SODIUM 100 MG PO CAPS
100.0000 mg | ORAL_CAPSULE | Freq: Two times a day (BID) | ORAL | Status: DC
  Administered 2019-06-02 – 2019-06-03 (×2): 100 mg via ORAL
  Filled 2019-06-02 (×2): qty 1

## 2019-06-02 MED ORDER — ONDANSETRON HCL 4 MG/2ML IJ SOLN
INTRAMUSCULAR | Status: AC
  Filled 2019-06-02: qty 2

## 2019-06-02 MED ORDER — CEFAZOLIN SODIUM-DEXTROSE 1-4 GM/50ML-% IV SOLN
1.0000 g | Freq: Four times a day (QID) | INTRAVENOUS | Status: AC
  Administered 2019-06-02 (×2): 1 g via INTRAVENOUS
  Filled 2019-06-02 (×2): qty 50

## 2019-06-02 MED ORDER — TRANEXAMIC ACID-NACL 1000-0.7 MG/100ML-% IV SOLN
1000.0000 mg | Freq: Once | INTRAVENOUS | Status: AC
  Administered 2019-06-02: 1000 mg via INTRAVENOUS
  Filled 2019-06-02: qty 100

## 2019-06-02 MED ORDER — ACETAMINOPHEN 500 MG PO TABS
1000.0000 mg | ORAL_TABLET | Freq: Once | ORAL | Status: AC
  Administered 2019-06-02: 1000 mg via ORAL
  Filled 2019-06-02: qty 2

## 2019-06-02 MED ORDER — MIDAZOLAM HCL 5 MG/5ML IJ SOLN
INTRAMUSCULAR | Status: DC | PRN
  Administered 2019-06-02: 2 mg via INTRAVENOUS

## 2019-06-02 MED ORDER — LACTATED RINGERS IV SOLN: INTRAVENOUS | Status: DC

## 2019-06-02 MED ORDER — BUPIVACAINE IN DEXTROSE 0.75-8.25 % IT SOLN
INTRATHECAL | Status: DC | PRN
  Administered 2019-06-02: 2 mL via INTRATHECAL

## 2019-06-02 MED ORDER — PROMETHAZINE HCL 25 MG/ML IJ SOLN: 6.2500 mg | INTRAMUSCULAR | Status: DC | PRN

## 2019-06-02 MED ORDER — POVIDONE-IODINE 10 % EX SWAB: 2.0000 "application " | Freq: Once | CUTANEOUS | Status: DC

## 2019-06-02 MED ORDER — ROSUVASTATIN CALCIUM 10 MG PO TABS
10.0000 mg | ORAL_TABLET | Freq: Every day | ORAL | Status: DC
  Administered 2019-06-02: 10 mg via ORAL
  Filled 2019-06-02: qty 1

## 2019-06-02 MED ORDER — MIDAZOLAM HCL 2 MG/2ML IJ SOLN
INTRAMUSCULAR | Status: AC
  Filled 2019-06-02: qty 2

## 2019-06-02 MED ORDER — SORBITOL 70 % SOLN
30.0000 mL | Freq: Every day | Status: DC | PRN
  Filled 2019-06-02: qty 30

## 2019-06-02 MED ORDER — ASPIRIN 81 MG PO CHEW
81.0000 mg | CHEWABLE_TABLET | Freq: Two times a day (BID) | ORAL | Status: DC
  Administered 2019-06-02 – 2019-06-03 (×2): 81 mg via ORAL
  Filled 2019-06-02 (×2): qty 1

## 2019-06-02 MED ORDER — ROPIVACAINE HCL 7.5 MG/ML IJ SOLN
INTRAMUSCULAR | Status: DC | PRN
  Administered 2019-06-02: 20 mL via PERINEURAL

## 2019-06-02 MED ORDER — WATER FOR IRRIGATION, STERILE IR SOLN
Status: DC | PRN
  Administered 2019-06-02: 2000 mL

## 2019-06-02 MED ORDER — SENNOSIDES-DOCUSATE SODIUM 8.6-50 MG PO TABS: 1.0000 | ORAL_TABLET | Freq: Every evening | ORAL | Status: DC | PRN

## 2019-06-02 MED ORDER — FLEET ENEMA 7-19 GM/118ML RE ENEM: 1.0000 | ENEMA | Freq: Once | RECTAL | Status: DC | PRN

## 2019-06-02 MED ORDER — PROPOFOL 10 MG/ML IV BOLUS
INTRAVENOUS | Status: DC | PRN
  Administered 2019-06-02: 20 mg via INTRAVENOUS
  Administered 2019-06-02: 30 mg via INTRAVENOUS
  Administered 2019-06-02: 20 mg via INTRAVENOUS

## 2019-06-02 MED ORDER — OXYCODONE HCL 5 MG PO TABS
5.0000 mg | ORAL_TABLET | ORAL | Status: DC | PRN
  Administered 2019-06-02 – 2019-06-03 (×4): 5 mg via ORAL
  Filled 2019-06-02 (×4): qty 1

## 2019-06-02 MED ORDER — BUPIVACAINE HCL (PF) 0.25 % IJ SOLN
INTRAMUSCULAR | Status: AC
  Filled 2019-06-02: qty 30

## 2019-06-02 MED ORDER — PHENYLEPHRINE 40 MCG/ML (10ML) SYRINGE FOR IV PUSH (FOR BLOOD PRESSURE SUPPORT)
PREFILLED_SYRINGE | INTRAVENOUS | Status: AC
  Filled 2019-06-02: qty 10

## 2019-06-02 MED ORDER — ONDANSETRON HCL 4 MG/2ML IJ SOLN: 4.0000 mg | Freq: Four times a day (QID) | INTRAMUSCULAR | Status: DC | PRN

## 2019-06-02 SURGICAL SUPPLY — 66 items
APL SKNCLS STERI-STRIP NONHPOA (GAUZE/BANDAGES/DRESSINGS) ×1
ATTUNE MED DOME PAT 41 KNEE (Knees) ×1 IMPLANT
ATTUNE MED DOME PAT 41MM KNEE (Knees) ×1 IMPLANT
BAG DECANTER FOR FLEXI CONT (MISCELLANEOUS) ×3 IMPLANT
BAG SPEC THK2 15X12 ZIP CLS (MISCELLANEOUS) ×1
BAG ZIPLOCK 12X15 (MISCELLANEOUS) ×3 IMPLANT
BASE TIBIAL ROT PLAT SZ 10 KNE (Miscellaneous) IMPLANT
BENZOIN TINCTURE PRP APPL 2/3 (GAUZE/BANDAGES/DRESSINGS) ×3 IMPLANT
BLADE SAGITTAL 25.0X1.19X90 (BLADE) ×2 IMPLANT
BLADE SAGITTAL 25.0X1.19X90MM (BLADE) ×1
BLADE SAW SGTL 13X75X1.27 (BLADE) ×3 IMPLANT
BLADE SURG 15 STRL LF DISP TIS (BLADE) ×1 IMPLANT
BLADE SURG 15 STRL SS (BLADE) ×3
BLADE SURG SZ10 CARB STEEL (BLADE) ×3 IMPLANT
BNDG CMPR MED 15X6 ELC VLCR LF (GAUZE/BANDAGES/DRESSINGS) ×1
BNDG ELASTIC 6X15 VLCR STRL LF (GAUZE/BANDAGES/DRESSINGS) ×3 IMPLANT
BOWL SMART MIX CTS (DISPOSABLE) ×3 IMPLANT
BSPLAT TIB 10 CMNT ROT PLAT (Miscellaneous) ×1 IMPLANT
CEMENT HV SMART SET (Cement) ×4 IMPLANT
CLOSURE STERI-STRIP 1/2X4 (GAUZE/BANDAGES/DRESSINGS) ×2
CLSR STERI-STRIP ANTIMIC 1/2X4 (GAUZE/BANDAGES/DRESSINGS) ×4 IMPLANT
COMP FEM CMT ATTUNE SZ10 LT (Femur) ×3 IMPLANT
COMPONENT FEM CMT ATTN SZ10 LT (Femur) IMPLANT
COVER SURGICAL LIGHT HANDLE (MISCELLANEOUS) ×3 IMPLANT
COVER WAND RF STERILE (DRAPES) ×3 IMPLANT
CUFF TOURN SGL QUICK 34 (TOURNIQUET CUFF) ×3
CUFF TRNQT CYL 34X4.125X (TOURNIQUET CUFF) ×1 IMPLANT
DECANTER SPIKE VIAL GLASS SM (MISCELLANEOUS) ×6 IMPLANT
DRAPE INCISE IOBAN 66X45 STRL (DRAPES) ×2 IMPLANT
DRAPE U-SHAPE 47X51 STRL (DRAPES) ×3 IMPLANT
DRESSING AQUACEL AG SP 3.5X10 (GAUZE/BANDAGES/DRESSINGS) ×1 IMPLANT
DRSG AQUACEL AG SP 3.5X10 (GAUZE/BANDAGES/DRESSINGS) ×3
DURAPREP 26ML APPLICATOR (WOUND CARE) ×6 IMPLANT
ELECT REM PT RETURN 15FT ADLT (MISCELLANEOUS) ×3 IMPLANT
GLOVE BIOGEL PI IND STRL 8 (GLOVE) ×2 IMPLANT
GLOVE BIOGEL PI INDICATOR 8 (GLOVE) ×4
GLOVE SURG ORTHO 8.0 STRL STRW (GLOVE) ×3 IMPLANT
GLOVE SURG SS PI 7.5 STRL IVOR (GLOVE) ×3 IMPLANT
GOWN STRL REUS W/TWL XL LVL3 (GOWN DISPOSABLE) ×6 IMPLANT
HANDPIECE INTERPULSE COAX TIP (DISPOSABLE) ×3
HOLDER FOLEY CATH W/STRAP (MISCELLANEOUS) IMPLANT
HOOD PEEL AWAY FLYTE STAYCOOL (MISCELLANEOUS) ×3 IMPLANT
IMMOBILIZER KNEE 20 (SOFTGOODS) ×3
IMMOBILIZER KNEE 20 THIGH 36 (SOFTGOODS) ×1 IMPLANT
INSERT TIB ATTUNE RP SZ10X6 (Insert) ×2 IMPLANT
KIT TURNOVER KIT A (KITS) IMPLANT
MANIFOLD NEPTUNE II (INSTRUMENTS) ×3 IMPLANT
NEEDLE HYPO 22GX1.5 SAFETY (NEEDLE) ×6 IMPLANT
NS IRRIG 1000ML POUR BTL (IV SOLUTION) ×3 IMPLANT
PACK ICE MAXI GEL EZY WRAP (MISCELLANEOUS) ×3 IMPLANT
PACK TOTAL KNEE CUSTOM (KITS) ×3 IMPLANT
PENCIL SMOKE EVACUATOR (MISCELLANEOUS) IMPLANT
PIN DRILL FIX HALF THREAD (BIT) ×2 IMPLANT
PIN STEINMAN FIXATION KNEE (PIN) ×2 IMPLANT
PROTECTOR NERVE ULNAR (MISCELLANEOUS) ×3 IMPLANT
SET HNDPC FAN SPRY TIP SCT (DISPOSABLE) ×1 IMPLANT
SUT ETHIBOND NAB CT1 #1 30IN (SUTURE) ×8 IMPLANT
SUT MNCRL AB 3-0 PS2 18 (SUTURE) ×3 IMPLANT
SUT VIC AB 0 CT1 36 (SUTURE) ×3 IMPLANT
SUT VIC AB 2-0 CT1 27 (SUTURE) ×6
SUT VIC AB 2-0 CT1 TAPERPNT 27 (SUTURE) ×2 IMPLANT
SYR CONTROL 10ML LL (SYRINGE) ×9 IMPLANT
TIBIAL BASE ROT PLAT SZ 10 KNE (Miscellaneous) ×3 IMPLANT
TRAY FOLEY MTR SLVR 16FR STAT (SET/KITS/TRAYS/PACK) ×3 IMPLANT
WATER STERILE IRR 1000ML POUR (IV SOLUTION) ×6 IMPLANT
YANKAUER SUCT BULB TIP 10FT TU (MISCELLANEOUS) ×3 IMPLANT

## 2019-06-02 NOTE — Op Note (Signed)
NAME: Brian Rush, Brian Rush ACCOUNT [id number] DATE OF BIRTH:Oct 11, 1945 FACILITY: [facility] LOCATION: [id number] PHYSICIAN:W. Jordi Kamm JR., MD  OPERATIVE REPORT  DATE OF PROCEDURE:  06/02/2019  PREOPERATIVE DIAGNOSIS:  Severe osteoarthritis, left knee.  OPERATION:  Left total knee replacement (Attune size 10 femur, tibia with a 6 mm bearing and 41 mm all poly patella).  SURGEON:  Vangie Bicker, MD  ASSISTANT:  Jennet Maduro, PA.  ANESTHESIA:  Spinal with adductor block.  DESCRIPTION OF PROCEDURE:  In the supine position, exsanguination of leg, inflation of thigh tourniquet to 350.  Straight skin incision with medial parapatellar approach to the knee made.  We did a 5-degree 9 mm valgus cut on the distal femur, then  resecting about 9 mm below the least diseased medial compartment with the extension gap, eventually measured at 6 mm.  Femur was sized to be a size 10.  We placed the all-in-1 cutting block in appropriate degree of external rotation.  Also had raised the  block 1 mm to prevent excessive notching, accomplishing anterior, posterior and chamfer cuts without difficulty.  PCL was released and we excised the excess osteophytes from the posterior aspect of the knee.  Mixture of Marcaine and Exparel was  infiltrated in the subcutaneous tissues and capsular structures.  [name] was cut for the tibia with a size 10 tibial baseplate, followed by placement of the box cut on the femur, placement of the femoral trial.  The patient had a moderate flexion  contracture and this was resolved with full extension.  Patella was cut, leaving about 21 mm of native patella to replace about 9.5 mm of patella.  All trials were placed.  Full extension, excellent stability, no tendency for bearing spinout and good  tracking of the femur and tibia throughout all angles of flexion and extension were noted.  Cement was prepared on the back table.  Components were inserted, tibia,  followed by femur and patella with the trial bearing inserted to allow the cement to  harden.  Cement was hardened.  The trial bearing was removed.  Small bits of cement were removed from the posterior aspect of the knee.  Tourniquet was released under direct vision with the trial bearing out.  No excessive bleeding was noted.  Small  bleeders were coagulated.  Final bearing was placed.  We then closed with #1 Ethibond, 2-0 Vicryl and Monocryl in the skin.  He was taken to the recovery room in stable condition.  VN/NUANCE  D:06/02/2019 T:06/02/2019 JOB:[id number]

## 2019-06-02 NOTE — Interval H&P Note (Signed)
History and Physical Interval Note:  06/02/2019 7:34 AM  Brian Rush  has presented today for surgery, with the diagnosis of OA LEFT KNEE.  The various methods of treatment have been discussed with the patient and family. After consideration of risks, benefits and other options for treatment, the patient has consented to  Procedure(s): TOTAL KNEE ARTHROPLASTY (Left) as a surgical intervention.  The patient's history has been reviewed, patient examined, no change in status, stable for surgery.  I have reviewed the patient's chart and labs.  Questions were answered to the patient's satisfaction.     Yvette Rack

## 2019-06-02 NOTE — Discharge Instructions (Signed)

## 2019-06-02 NOTE — Plan of Care (Signed)

## 2019-06-02 NOTE — Brief Op Note (Signed)
06/02/2019  10:21 AM  PATIENT:  Brian Rush  74 y.o. male  PRE-OPERATIVE DIAGNOSIS:  OA LEFT KNEE  POST-OPERATIVE DIAGNOSIS:  OA LEFT KNEE  PROCEDURE:  Procedure(s): TOTAL KNEE ARTHROPLASTY (Left)  SURGEON:  Surgeon(s) and Role:    Earlie Server, MD - Primary  PHYSICIAN ASSISTANT: Chriss Czar, PA-C  ASSISTANTS: OR staff x1   ANESTHESIA:   local, regional, spinal and IV sedation  EBL:  100 mL   BLOOD ADMINISTERED:none  DRAINS: none   LOCAL MEDICATIONS USED:  MARCAINE     SPECIMEN:  No Specimen  DISPOSITION OF SPECIMEN:  N/A  COUNTS:  YES  TOURNIQUET:   Total Tourniquet Time Documented: Thigh (Left) - 73 minutes Total: Thigh (Left) - 73 minutes   DICTATION: .Other Dictation: Dictation Number unknown  PLAN OF CARE: Admit for overnight observation  PATIENT DISPOSITION:  PACU - hemodynamically stable.   Delay start of Pharmacological VTE agent (>24hrs) due to surgical blood loss or risk of bleeding: yes

## 2019-06-02 NOTE — Anesthesia Procedure Notes (Signed)
Anesthesia Regional Block: Adductor canal block   Pre-Anesthetic Checklist: ,, timeout performed, Correct Patient, Correct Site, Correct Laterality, Correct Procedure, Correct Position, site marked, Risks and benefits discussed,  Surgical consent,  Pre-op evaluation,  At surgeon's request and post-op pain management  Laterality: Left  Prep: chloraprep       Needles:  Injection technique: Single-shot  Needle Type: Stimulator Needle - 80     Needle Length: 10cm  Needle Gauge: 21     Additional Needles:   Narrative:  Start time: 06/02/2019 6:53 AM End time: 06/02/2019 7:03 AM Injection made incrementally with aspirations every 5 mL.  Performed by: Personally

## 2019-06-02 NOTE — Transfer of Care (Signed)
Immediate Anesthesia Transfer of Care Note  Patient: Brian Rush  Procedure(s) Performed: TOTAL KNEE ARTHROPLASTY (Left Knee)  Patient Location: PACU  Anesthesia Type:Spinal  Level of Consciousness: drowsy  Airway & Oxygen Therapy: Patient Spontanous Breathing and Patient connected to face mask oxygen  Post-op Assessment: Report given to RN and Post -op Vital signs reviewed and stable  Post vital signs: Reviewed and stable  Last Vitals:  Vitals Value Taken Time  BP 128/92 06/02/19 1023  Temp    Pulse 58 06/02/19 1024  Resp 15 06/02/19 1024  SpO2 100 % 06/02/19 1024  Vitals shown include unvalidated device data.  Last Pain:  Vitals:   06/02/19 0616  TempSrc: Oral         Complications: No apparent anesthesia complications

## 2019-06-02 NOTE — Progress Notes (Signed)
Orthopedic Tech Progress Note Patient Details:  Brian Rush [facility] [birth year] [id number] Bone foam left on bed CPM Left Knee CPM Left Knee: On Left Knee Flexion (Degrees): 90 Left Knee Extension (Degrees): 0  Post Interventions Patient Tolerated: Well Instructions Provided: Care of device, Adjustment of device  [name] 06/02/2019, 10:57 AM

## 2019-06-02 NOTE — Evaluation (Signed)
Physical Therapy Evaluation Patient Details Name: Brian Rush MRN: [id number] DOB: 1945/08/16 Today's Date: 06/02/2019   History of Present Illness  Pt s/p L TKR    Clinical Impression  Pt s/p L TKR and presents with decreased L LE strength/ROM and post op pain limiting functional mobility.  Pt should progress to dc home with family assist and HHPT follow up.    Follow Up Recommendations Home health PT;Follow surgeon's recommendation for DC plan and follow-up therapies    Equipment Recommendations  None recommended by PT    Recommendations for Other Services       Precautions / Restrictions Precautions Precautions: Fall;Knee Required Braces or Orthoses: Knee Immobilizer - Left Knee Immobilizer - Left: Discontinue once straight leg raise with < 10 degree lag Restrictions Weight Bearing Restrictions: No Other Position/Activity Restrictions: WBAT      Mobility  Bed Mobility Overal bed mobility: Needs Assistance Bed Mobility: Supine to Sit     Supine to sit: Min assist     General bed mobility comments: cues for sequence and use of R LE to self assist; physical assist to manage L LE  Transfers Overall transfer level: Needs assistance Equipment used: Rolling walker (2 wheeled) Transfers: Sit to/from Stand Sit to Stand: Min assist;From elevated surface         General transfer comment: cues for LE management and use of UEs to self assist  Ambulation/Gait Ambulation/Gait assistance: Min assist Gait Distance (Feet): 50 Feet Assistive device: Rolling walker (2 wheeled) Gait Pattern/deviations: Step-to pattern;Decreased step length - right;Decreased step length - left;Shuffle;Trunk flexed Gait velocity: decr   General Gait Details: cues for sequence, posture and position from RW; intemittent mild buckling at L knee but with pt compensating well with UE  Stairs            Wheelchair Mobility    Modified Rankin (Stroke Patients Only)       Balance  Overall balance assessment: Needs assistance Sitting-balance support: No upper extremity supported;Feet supported Sitting balance-Leahy Scale: Good     Standing balance support: Bilateral upper extremity supported Standing balance-Leahy Scale: Poor                               Pertinent Vitals/Pain Pain Assessment: No/denies pain    Home Living Family/patient expects to be discharged to:: Private residence Living Arrangements: Spouse/significant other Available Help at Discharge: Family Type of Home: House Home Access: Stairs to enter Entrance Stairs-Rails: Right Entrance Stairs-Number of Steps: 2 Home Layout: Two level Home Equipment: Environmental consultant - 2 wheels;Cane - single point      Prior Function Level of Independence: Independent               Hand Dominance        Extremity/Trunk Assessment   Upper Extremity Assessment Upper Extremity Assessment: Overall WFL for tasks assessed    Lower Extremity Assessment Lower Extremity Assessment: LLE deficits/detail LLE Deficits / Details: Ltd quads requring use of KI to stabilize knee with WB       Communication   Communication: No difficulties  Cognition Arousal/Alertness: Awake/alert Behavior During Therapy: WFL for tasks assessed/performed Overall Cognitive Status: Within Functional Limits for tasks assessed                                        General Comments  Exercises Total Joint Exercises Ankle Circles/Pumps: AROM;Both;20 reps;Supine   Assessment/Plan    PT Assessment Patient needs continued PT services  PT Problem List Decreased strength;Decreased range of motion;Decreased activity tolerance;Decreased balance;Decreased mobility;Decreased knowledge of use of DME       PT Treatment Interventions DME instruction;Gait training;Stair training;Functional mobility training;Therapeutic activities;Therapeutic exercise;Patient/family education    PT Goals (Current goals can  be found in the Care Plan section)  Acute Rehab PT Goals Patient Stated Goal: Regain IND PT Goal Formulation: With patient Time For Goal Achievement: 06/16/19 Potential to Achieve Goals: Good    Frequency 7X/week   Barriers to discharge        Co-evaluation               AM-PAC PT "6 Clicks" Mobility  Outcome Measure Help needed turning from your back to your side while in a flat bed without using bedrails?: A Little Help needed moving from lying on your back to sitting on the side of a flat bed without using bedrails?: A Little Help needed moving to and from a bed to a chair (including a wheelchair)?: A Little Help needed standing up from a chair using your arms (e.g., wheelchair or bedside chair)?: A Little Help needed to walk in hospital room?: A Little Help needed climbing 3-5 steps with a railing? : A Lot 6 Click Score: 17    End of Session Equipment Utilized During Treatment: Gait belt;Left knee immobilizer Activity Tolerance: Patient tolerated treatment well Patient left: in chair;with call bell/phone within reach;with chair alarm set;with family/visitor present Nurse Communication: Mobility status PT Visit Diagnosis: Difficulty in walking, not elsewhere classified (R26.2)    Time: 1540-1610 PT Time Calculation (min) (ACUTE ONLY): 30 min   Charges:   PT Evaluation $PT Eval Low Complexity: 1 Low PT Treatments $Gait Training: 8-22 mins        Debe Coder PT Acute Rehabilitation Services Pager [phone] Office [place] Pager [phone] Office [phone]   Sujey Gundry 06/02/2019, 5:40 PM

## 2019-06-02 NOTE — Anesthesia Postprocedure Evaluation (Signed)
Anesthesia Post Note  Patient: Brian Rush  Procedure(s) Performed: TOTAL KNEE ARTHROPLASTY (Left Knee)     Patient location during evaluation: PACU Anesthesia Type: Spinal Level of consciousness: awake and alert Pain management: pain level controlled Vital Signs Assessment: post-procedure vital signs reviewed and stable Respiratory status: spontaneous breathing and respiratory function stable Cardiovascular status: blood pressure returned to baseline and stable Postop Assessment: spinal receding Anesthetic complications: no    Last Vitals:  Vitals:   06/02/19 1100 06/02/19 1115  BP: (!) 151/95 (!) 161/91  Pulse: (!) 57 (!) 58  Resp: 11 15  Temp:    SpO2: 100% 100%    Last Pain:  Vitals:   06/02/19 1115  TempSrc:   PainSc: 2                  Tanetta Fuhriman DANIEL

## 2019-06-02 NOTE — Anesthesia Procedure Notes (Signed)
Spinal  Patient location during procedure: OR Start time: 06/02/2019 7:42 AM End time: 06/02/2019 7:45 AM Staffing Performed: resident/CRNA  Anesthesiologist: Duane Boston, MD Resident/CRNA: Montel Clock, CRNA Preanesthetic Checklist Completed: patient identified, IV checked, risks and benefits discussed, surgical consent, monitors and equipment checked, pre-op evaluation and timeout performed Spinal Block Patient position: sitting Prep: DuraPrep Patient monitoring: heart rate, cardiac monitor, continuous pulse ox and blood pressure Approach: midline Location: L3-4 Injection technique: single-shot Needle Needle type: Pencan  Needle gauge: 24 G Needle length: 10 cm Needle insertion depth: 7 cm Assessment Sensory level: T6

## 2019-06-03 DIAGNOSIS — R519 Headache, unspecified: Secondary | ICD-10-CM | POA: Diagnosis not present

## 2019-06-03 DIAGNOSIS — K219 Gastro-esophageal reflux disease without esophagitis: Secondary | ICD-10-CM | POA: Diagnosis not present

## 2019-06-03 DIAGNOSIS — M1712 Unilateral primary osteoarthritis, left knee: Secondary | ICD-10-CM | POA: Diagnosis not present

## 2019-06-03 DIAGNOSIS — Z79899 Other long term (current) drug therapy: Secondary | ICD-10-CM | POA: Diagnosis not present

## 2019-06-03 NOTE — Discharge Summary (Signed)
Discharge Summary  Patient ID: Brian Rush MRN: [id number] DOB/AGE: [birth year] 74 y.o.  Admit date: 06/02/2019 Discharge date: 06/03/2019  Admission Diagnoses:  Primary localized osteoarthritis of left knee  Discharge Diagnoses:  Principal Problem:   Primary localized osteoarthritis of left knee   Past Medical History:  Diagnosis Date  . Allergy   . Arthritis   . GERD (gastroesophageal reflux disease)   . Headache   . 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 male who was admitted 06/02/2019 with a diagnosis of Primary localized osteoarthritis of left knee and went to the operating room on 06/02/2019 and underwent the above named procedures.     He was given perioperative antibiotics:  Anti-infectives (From admission, onward)   Start     Dose/Rate Route Frequency Ordered Stop   06/02/19 1400  ceFAZolin (ANCEF) IVPB 1 g/50 mL premix     1 g 100 mL/hr over 30 Minutes Intravenous Every 6 hours 06/02/19 1148 06/02/19 2045   06/02/19 0615  ceFAZolin (ANCEF) IVPB 2g/100 mL premix     2 g 200 mL/hr over 30 Minutes Intravenous On call to O.R. 06/02/19 0600 06/02/19 0756    .  He was given sequential compression devices, early ambulation, and Aspirin for DVT prophylaxis.  He benefited maximally from the hospital stay and there were no complications.    Recent vital signs:  Vitals:   06/03/19 0053 06/03/19 0436  BP: 117/66 109/72  Pulse: 68 70  Resp: 16 14  Temp: 98.6 F (37 C) 98.9 F (37.2 C)  SpO2: 95% 97%    Recent laboratory studies:  Lab Results  Component Value Date   HGB 14.5 05/22/2019   HGB 9.5 (L) 08/23/2009   HGB 10.5 (L) 08/22/2009   Lab Results  Component Value Date   WBC 6.9 05/22/2019   PLT 205 05/22/2019   Lab Results  Component Value Date   INR 1.1  05/22/2019   Lab Results  Component Value Date   NA 142 05/22/2019   K 4.7 05/22/2019   CL 108 05/22/2019   CO2 28 05/22/2019   BUN 19 05/22/2019   CREATININE 0.81 05/22/2019   GLUCOSE 104 (H) 05/22/2019    Discharge Medications:   Allergies as of 06/03/2019      Reactions   Latex Other (See Comments)   Latex bandage--skin burn around the perimeter      Medication List    STOP taking these medications   ibuprofen 200 MG tablet Commonly known as: ADVIL   meloxicam 15 MG tablet Commonly known as: MOBIC   naproxen sodium 220 MG tablet Commonly known as: ALEVE     TAKE these medications   acetaminophen 325 MG tablet Commonly known as: Tylenol Take 2 tablets (650 mg total) by mouth every 4 (four) hours as needed.   Allergy Eye Drops 0.025 % ophthalmic solution Generic drug: ketotifen Place 1 drop into both eyes 2 (two) times daily as needed (allergies.).   aspirin EC 81 MG tablet Take 1 tablet (81 mg total) by mouth 2 (two) times daily. TO PREVENT BLOOD CLOTS What changed:   medication strength  how much to take  when to take this  reasons to take this  additional instructions   celecoxib 200 MG capsule Commonly known as: CeleBREX Take 1 capsule (  200 mg total) by mouth 2 (two) times daily.   CoQ10 100 MG Caps Take 100 mg by mouth daily.   COSAMIN DS PO Take 1 tablet by mouth in the morning and at bedtime.   DAYTIME/NIGHTTIME PO Take 1-2 tablets by mouth 3 (three) times daily as needed (cold symptoms).   docusate sodium 100 MG capsule Commonly known as: Colace Take 1 capsule (100 mg total) by mouth daily as needed.   fluticasone 50 MCG/ACT nasal spray Commonly known as: FLONASE Place 1-2 sprays into both nostrils daily as needed for allergies or rhinitis.   gabapentin 300 MG capsule Commonly known as: Neurontin Take 1 capsule (300 mg total) by mouth 3 (three) times daily for 14 days, THEN 1 capsule (300 mg total) 2 (two) times daily for 3 days,  THEN 1 capsule (300 mg total) daily for 4 days. Start taking on: June 02, 2019   loratadine 10 MG tablet Commonly known as: CLARITIN Take 10 mg by mouth daily as needed for allergies.   Lubricant Eye Drops 0.4-0.3 % Soln Generic drug: Polyethyl Glycol-Propyl Glycol Place 1-2 drops into both eyes 3 (three) times daily as needed (dry/irritated eyes.).   multivitamin with minerals Tabs tablet Take 1 tablet by mouth daily.   Omega-3 1000 MG Caps Take 1,000 mg by mouth daily.   oxyCODONE 5 MG immediate release tablet Commonly known as: Oxy IR/ROXICODONE Take one tab po q4-6hrs prn pain, may need 1-2 first couple weeks   oxymetazoline 0.05 % nasal spray Commonly known as: AFRIN Place 1 spray into both nostrils 2 (two) times daily as needed for congestion.   phenylephrine 10 MG Tabs tablet Commonly known as: SUDAFED PE Take 10 mg by mouth every 4 (four) hours as needed (congestion.).   rosuvastatin 10 MG tablet Commonly known as: CRESTOR Take 10 mg by mouth at bedtime.   sildenafil 20 MG tablet Commonly known as: REVATIO Take 60-80 mg by mouth daily as needed (erectile dysfunction.).   Vitamin D3 50 MCG (2000 UT) Tabs Take 2,000 Units by mouth daily.            Durable Medical Equipment  (From admission, onward)         Start     Ordered   06/02/19 1149  DME Walker rolling  Once    Question Answer Comment  Walker: With 5 Inch Wheels   Patient needs a walker to treat with the following condition Primary localized osteoarthritis of left knee      06/02/19 1148          Diagnostic Studies: No results found.  Disposition: Discharge disposition: 01-Home or Self Care       Discharge Instructions    Discharge patient   Complete by: As directed    Discharge disposition: 01-Home or Self Care   Discharge patient date: 06/03/2019      Follow-up Information    Earlie Server, MD. Go on 06/20/2019.   Specialty: Orthopedic Surgery Why: Your appointment has  been scheduled for 11;15 Contact information: [address] [place] [address] [phone]        [facility] Follow up.   Specialty: [place] Why: You will have 5 HHPT visits prior to starting outpatient physical therapy  Contact information: [address] [place] [address] [phone]        [facility], [place]. Go on 06/20/2019.   Why: You will start outpatient phyiscal therapy at 10:00.  pease arrive at 9:30 to complete your paperwork  Contact information: [facility] [place] [address] [phone]            Signed: Prudencio Burly III PA-C 06/03/2019, 10:40 AM

## 2019-06-03 NOTE — TOC Initial Note (Signed)
Transition of Care [facility]) - Initial/Assessment Note    Patient Details  Name: Brian Rush MRN: [id number] Date of Birth: 11-03-1945  [facility] CM/SW Contact:    Joaquin Courts, RN Phone Number: 06/03/2019, 10:45 AM  Clinical Narrative:  CM spoke with patient at bedside who reports he has rolling walker and 3in1. [facility] to provide [place] [facility].                   Expected Discharge Plan: [place] Barriers to Discharge: No Barriers Identified   Patient Goals and CMS Choice Patient states their goals for this hospitalization and ongoing recovery are:: to go home with therapy CMS Medicare.gov Compare Post Acute Care list provided to:: Patient Choice offered to / list presented to : Patient  Expected Discharge Plan and Services Expected Discharge Plan: [place]   Discharge Planning Services: CM Consult Post Acute Care Choice: [place] arrangements for the past 2 months: Single Family Home Expected Discharge Date: 06/03/19               DME Arranged: Gilford Rile rolling, CPM DME Agency: [employer]       HH Arranged: PT [place] [facility]: [facility] [employer] [facility] [employer]        Prior Living Arrangements/Services Living arrangements for the past 2 months: [place]   Patient language and need for interpreter reviewed:: Yes Do you feel safe going back to the place where you live?: Yes      Need for Family Participation in Patient Care: Yes (Comment) Care giver support system in place?: Yes (comment)   Criminal Activity/Legal Involvement Pertinent to Current Situation/Hospitalization: No - Comment as needed  Activities of Daily Living Home Assistive Devices/Equipment: Cane (specify quad or straight), Built-in shower seat, Grab bars in shower, Walker (specify type) ADL Screening (condition at time of admission) Patient's cognitive ability adequate to safely complete daily  activities?: Yes Is the patient deaf or have difficulty hearing?: No Does the patient have difficulty seeing, even when wearing glasses/contacts?: No Does the patient have difficulty concentrating, remembering, or making decisions?: No Patient able to express need for assistance with ADLs?: Yes Does the patient have difficulty dressing or bathing?: No Independently performs ADLs?: Yes (appropriate for developmental age) Does the patient have difficulty walking or climbing stairs?: Yes Weakness of Legs: None Weakness of Arms/Hands: None  Permission Sought/Granted                  Emotional Assessment Appearance:: Appears stated age Attitude/Demeanor/Rapport: Engaged Affect (typically observed): Accepting Orientation: : Oriented to Self, Oriented to Place, Oriented to  Time, Oriented to Situation   Psych Involvement: No (comment)  Admission diagnosis:  Primary localized osteoarthritis of left knee [M17.12] Patient Active Problem List   Diagnosis Date Noted  . Primary localized osteoarthritis of left knee 06/02/2019   PCP:  Antony Contras, MD Pharmacy:   [facility], [place] [address] Phone: [phone] Fax: [phone]     Social Determinants of Health (SDOH) Interventions    Readmission Risk Interventions No flowsheet data found.

## 2019-06-03 NOTE — Plan of Care (Signed)
Pt ready for DC home 

## 2019-06-03 NOTE — Progress Notes (Signed)
Physical Therapy Treatment Patient Details Name: Brian Rush MRN: [id number] DOB: [birth year] Today's Date: 06/03/2019    History of Present Illness Pt s/p L TKR      PT Comments    Pt progressing with mobility and hopeful for dc home this pm.  Noted improvement in quad strength but still not sufficient for SLR.  Will review stairs on arrival of spouse.   Follow Up Recommendations  Home health PT;Follow surgeon's recommendation for DC plan and follow-up therapies     Equipment Recommendations  None recommended by PT    Recommendations for Other Services       Precautions / Restrictions Precautions Precautions: Fall;Knee Required Braces or Orthoses: Knee Immobilizer - Left Knee Immobilizer - Left: Discontinue once straight leg raise with < 10 degree lag Restrictions Weight Bearing Restrictions: No Other Position/Activity Restrictions: WBAT    Mobility  Bed Mobility Overal bed mobility: Needs Assistance Bed Mobility: Supine to Sit     Supine to sit: Min guard     General bed mobility comments: cues for sequence and use of R LE to self assist; physical assist to manage L LE  Transfers Overall transfer level: Needs assistance Equipment used: Rolling walker (2 wheeled) Transfers: Sit to/from Stand Sit to Stand: Min guard         General transfer comment: cues for LE management and use of UEs to self assist  Ambulation/Gait Ambulation/Gait assistance: Min guard Gait Distance (Feet): 200 Feet Assistive device: Rolling walker (2 wheeled) Gait Pattern/deviations: Step-to pattern;Decreased step length - right;Decreased step length - left;Shuffle;Trunk flexed Gait velocity: decr   General Gait Details: cues for sequence, posture and position from RW; intemittent mild buckling at L knee but with pt compensating well with UE   Stairs             Wheelchair Mobility    Modified Rankin (Stroke Patients Only)       Balance Overall balance  assessment: Needs assistance Sitting-balance support: No upper extremity supported;Feet supported Sitting balance-Leahy Scale: Good     Standing balance support: Bilateral upper extremity supported Standing balance-Leahy Scale: Poor                              Cognition Arousal/Alertness: Awake/alert Behavior During Therapy: WFL for tasks assessed/performed Overall Cognitive Status: Within Functional Limits for tasks assessed                                        Exercises Total Joint Exercises Ankle Circles/Pumps: AROM;Both;20 reps;Supine Quad Sets: AROM;Both;10 reps;Supine Heel Slides: AAROM;Left;15 reps;Supine Straight Leg Raises: AAROM;Left;10 reps;Supine Goniometric ROM: AAROM L knee -5 - 60    General Comments        Pertinent Vitals/Pain Pain Assessment: No/denies pain    Home Living                      Prior Function            PT Goals (current goals can now be found in the care plan section) Acute Rehab PT Goals Patient Stated Goal: Regain IND PT Goal Formulation: With patient Time For Goal Achievement: 06/16/19 Potential to Achieve Goals: Good Progress towards PT goals: Progressing toward goals    Frequency    7X/week      PT Plan Current plan remains  appropriate    Co-evaluation              AM-PAC PT "6 Clicks" Mobility   Outcome Measure  Help needed turning from your back to your side while in a flat bed without using bedrails?: A Little Help needed moving from lying on your back to sitting on the side of a flat bed without using bedrails?: A Little Help needed moving to and from a bed to a chair (including a wheelchair)?: A Little Help needed standing up from a chair using your arms (e.g., wheelchair or bedside chair)?: A Little Help needed to walk in hospital room?: A Little Help needed climbing 3-5 steps with a railing? : A Lot 6 Click Score: 17    End of Session Equipment Utilized  During Treatment: Gait belt;Left knee immobilizer Activity Tolerance: Patient tolerated treatment well Patient left: in chair;with call bell/phone within reach;with chair alarm set Nurse Communication: Mobility status PT Visit Diagnosis: Difficulty in walking, not elsewhere classified (R26.2)     Time: 1020-1050 PT Time Calculation (min) (ACUTE ONLY): 30 min  Charges:  $Gait Training: 8-22 mins $Therapeutic Exercise: 8-22 mins                     [place] Pager [phone] Office [phone]    Johnette Teigen 06/03/2019, 12:28 PM

## 2019-06-03 NOTE — Progress Notes (Signed)
Physical Therapy Treatment Patient Details Name: Brian Rush MRN: [id number] DOB: 25-May-1945 Today's Date: 06/03/2019    History of Present Illness Pt s/p L TKR      PT Comments    Pt motivated and progressing well.  Spouse present to review don/doff KI, stairs and HEP - written instruction provided.   Follow Up Recommendations  Home health PT;Follow surgeon's recommendation for DC plan and follow-up therapies     Equipment Recommendations  None recommended by PT    Recommendations for Other Services       Precautions / Restrictions Precautions Precautions: Fall;Knee Required Braces or Orthoses: Knee Immobilizer - Left Knee Immobilizer - Left: Discontinue once straight leg raise with < 10 degree lag Restrictions Weight Bearing Restrictions: No Other Position/Activity Restrictions: WBAT    Mobility  Bed Mobility Overal bed mobility: Needs Assistance Bed Mobility: Supine to Sit     Supine to sit: Min guard     General bed mobility comments: NT - pt up in chair and requests back to same  Transfers Overall transfer level: Needs assistance Equipment used: Rolling walker (2 wheeled) Transfers: Sit to/from Stand Sit to Stand: Supervision         General transfer comment: cues for LE management and use of UEs to self assist  Ambulation/Gait Ambulation/Gait assistance: Min guard;Supervision Gait Distance (Feet): 200 Feet Assistive device: Rolling walker (2 wheeled) Gait Pattern/deviations: Step-to pattern;Decreased step length - right;Decreased step length - left;Shuffle;Trunk flexed Gait velocity: decr   General Gait Details: cues for sequence, posture and position from RW   Stairs Stairs: Yes Stairs assistance: Min assist Stair Management: One rail Right;Step to pattern;Forwards;With crutches Number of Stairs: 8 General stair comments: 4 steps twice with crutch and rail; VC for sequence and foot/crutch placement; spouse assisting on second  attempt   Wheelchair Mobility    Modified Rankin (Stroke Patients Only)       Balance Overall balance assessment: Needs assistance Sitting-balance support: No upper extremity supported;Feet supported Sitting balance-Leahy Scale: Good     Standing balance support: Bilateral upper extremity supported Standing balance-Leahy Scale: Fair                              Cognition Arousal/Alertness: Awake/alert Behavior During Therapy: WFL for tasks assessed/performed Overall Cognitive Status: Within Functional Limits for tasks assessed                                        Exercises Total Joint Exercises Ankle Circles/Pumps: AROM;Both;20 reps;Supine Quad Sets: AROM;Both;10 reps;Supine Heel Slides: AAROM;Left;15 reps;Supine Straight Leg Raises: AAROM;Left;10 reps;Supine Goniometric ROM: AAROM L knee -5 - 60    General Comments        Pertinent Vitals/Pain Pain Assessment: 0-10 Pain Score: 3  Pain Location: L knee Pain Descriptors / Indicators: Aching;Sore Pain Intervention(s): Limited activity within patient's tolerance;Monitored during session;Premedicated before session;Ice applied    Home Living                      Prior Function            PT Goals (current goals can now be found in the care plan section) Acute Rehab PT Goals Patient Stated Goal: Regain IND PT Goal Formulation: With patient Time For Goal Achievement: 06/16/19 Potential to Achieve Goals: Good Progress towards PT goals:  Progressing toward goals    Frequency    7X/week      PT Plan Current plan remains appropriate    Co-evaluation              AM-PAC PT "6 Clicks" Mobility   Outcome Measure  Help needed turning from your back to your side while in a flat bed without using bedrails?: A Little Help needed moving from lying on your back to sitting on the side of a flat bed without using bedrails?: A Little Help needed moving to and from a  bed to a chair (including a wheelchair)?: A Little Help needed standing up from a chair using your arms (e.g., wheelchair or bedside chair)?: A Little Help needed to walk in hospital room?: A Little Help needed climbing 3-5 steps with a railing? : A Little 6 Click Score: 18    End of Session Equipment Utilized During Treatment: Gait belt;Left knee immobilizer Activity Tolerance: Patient tolerated treatment well Patient left: in chair;with call bell/phone within reach;with chair alarm set Nurse Communication: Mobility status PT Visit Diagnosis: Difficulty in walking, not elsewhere classified (R26.2)     Time: [id number] PT Time Calculation (min) (ACUTE ONLY): 45 min  Charges:  $Gait Training: 8-22 mins $Therapeutic Exercise: 8-22 mins $Therapeutic Activity: 8-22 mins                     Debe Coder PT Acute Rehabilitation Services Pager [phone] Office [phone]    Brian Rush 06/03/2019, 12:34 PM

## 2019-06-04 DIAGNOSIS — Z471 Aftercare following joint replacement surgery: Secondary | ICD-10-CM | POA: Diagnosis not present

## 2019-06-04 DIAGNOSIS — H9190 Unspecified hearing loss, unspecified ear: Secondary | ICD-10-CM | POA: Diagnosis not present

## 2019-06-04 DIAGNOSIS — E785 Hyperlipidemia, unspecified: Secondary | ICD-10-CM | POA: Diagnosis not present

## 2019-06-04 DIAGNOSIS — Z96652 Presence of left artificial knee joint: Secondary | ICD-10-CM | POA: Diagnosis not present

## 2019-06-05 ENCOUNTER — Encounter: Payer: Self-pay | Admitting: *Deleted

## 2019-06-06 DIAGNOSIS — E785 Hyperlipidemia, unspecified: Secondary | ICD-10-CM | POA: Diagnosis not present

## 2019-06-06 DIAGNOSIS — Z96652 Presence of left artificial knee joint: Secondary | ICD-10-CM | POA: Diagnosis not present

## 2019-06-06 DIAGNOSIS — H9190 Unspecified hearing loss, unspecified ear: Secondary | ICD-10-CM | POA: Diagnosis not present

## 2019-06-06 DIAGNOSIS — Z471 Aftercare following joint replacement surgery: Secondary | ICD-10-CM | POA: Diagnosis not present

## 2019-06-08 DIAGNOSIS — H9190 Unspecified hearing loss, unspecified ear: Secondary | ICD-10-CM | POA: Diagnosis not present

## 2019-06-08 DIAGNOSIS — E785 Hyperlipidemia, unspecified: Secondary | ICD-10-CM | POA: Diagnosis not present

## 2019-06-08 DIAGNOSIS — Z96652 Presence of left artificial knee joint: Secondary | ICD-10-CM | POA: Diagnosis not present

## 2019-06-08 DIAGNOSIS — Z471 Aftercare following joint replacement surgery: Secondary | ICD-10-CM | POA: Diagnosis not present

## 2019-06-12 DIAGNOSIS — Z96652 Presence of left artificial knee joint: Secondary | ICD-10-CM | POA: Diagnosis not present

## 2019-06-12 DIAGNOSIS — E785 Hyperlipidemia, unspecified: Secondary | ICD-10-CM | POA: Diagnosis not present

## 2019-06-12 DIAGNOSIS — Z471 Aftercare following joint replacement surgery: Secondary | ICD-10-CM | POA: Diagnosis not present

## 2019-06-12 DIAGNOSIS — H9190 Unspecified hearing loss, unspecified ear: Secondary | ICD-10-CM | POA: Diagnosis not present

## 2019-06-15 DIAGNOSIS — H9190 Unspecified hearing loss, unspecified ear: Secondary | ICD-10-CM | POA: Diagnosis not present

## 2019-06-15 DIAGNOSIS — Z471 Aftercare following joint replacement surgery: Secondary | ICD-10-CM | POA: Diagnosis not present

## 2019-06-15 DIAGNOSIS — E785 Hyperlipidemia, unspecified: Secondary | ICD-10-CM | POA: Diagnosis not present

## 2019-06-15 DIAGNOSIS — Z96652 Presence of left artificial knee joint: Secondary | ICD-10-CM | POA: Diagnosis not present

## 2019-06-20 DIAGNOSIS — M1712 Unilateral primary osteoarthritis, left knee: Secondary | ICD-10-CM | POA: Diagnosis not present

## 2019-06-20 DIAGNOSIS — M25562 Pain in left knee: Secondary | ICD-10-CM | POA: Diagnosis not present

## 2019-06-20 DIAGNOSIS — M25662 Stiffness of left knee, not elsewhere classified: Secondary | ICD-10-CM | POA: Diagnosis not present

## 2019-06-20 DIAGNOSIS — M25462 Effusion, left knee: Secondary | ICD-10-CM | POA: Diagnosis not present

## 2019-06-20 DIAGNOSIS — M6281 Muscle weakness (generalized): Secondary | ICD-10-CM | POA: Diagnosis not present

## 2019-06-22 DIAGNOSIS — M1711 Unilateral primary osteoarthritis, right knee: Secondary | ICD-10-CM | POA: Diagnosis not present

## 2019-06-22 DIAGNOSIS — E782 Mixed hyperlipidemia: Secondary | ICD-10-CM | POA: Diagnosis not present

## 2019-06-22 DIAGNOSIS — N400 Enlarged prostate without lower urinary tract symptoms: Secondary | ICD-10-CM | POA: Diagnosis not present

## 2019-06-23 DIAGNOSIS — M25662 Stiffness of left knee, not elsewhere classified: Secondary | ICD-10-CM | POA: Diagnosis not present

## 2019-06-23 DIAGNOSIS — M25462 Effusion, left knee: Secondary | ICD-10-CM | POA: Diagnosis not present

## 2019-06-23 DIAGNOSIS — M6281 Muscle weakness (generalized): Secondary | ICD-10-CM | POA: Diagnosis not present

## 2019-06-23 DIAGNOSIS — M25562 Pain in left knee: Secondary | ICD-10-CM | POA: Diagnosis not present

## 2019-06-27 DIAGNOSIS — M6281 Muscle weakness (generalized): Secondary | ICD-10-CM | POA: Diagnosis not present

## 2019-06-27 DIAGNOSIS — M25562 Pain in left knee: Secondary | ICD-10-CM | POA: Diagnosis not present

## 2019-06-27 DIAGNOSIS — M25462 Effusion, left knee: Secondary | ICD-10-CM | POA: Diagnosis not present

## 2019-06-27 DIAGNOSIS — M25662 Stiffness of left knee, not elsewhere classified: Secondary | ICD-10-CM | POA: Diagnosis not present

## 2019-06-30 DIAGNOSIS — M25462 Effusion, left knee: Secondary | ICD-10-CM | POA: Diagnosis not present

## 2019-06-30 DIAGNOSIS — M25662 Stiffness of left knee, not elsewhere classified: Secondary | ICD-10-CM | POA: Diagnosis not present

## 2019-06-30 DIAGNOSIS — M6281 Muscle weakness (generalized): Secondary | ICD-10-CM | POA: Diagnosis not present

## 2019-06-30 DIAGNOSIS — M25562 Pain in left knee: Secondary | ICD-10-CM | POA: Diagnosis not present

## 2019-07-04 DIAGNOSIS — Z471 Aftercare following joint replacement surgery: Secondary | ICD-10-CM | POA: Diagnosis not present

## 2019-07-04 DIAGNOSIS — M25662 Stiffness of left knee, not elsewhere classified: Secondary | ICD-10-CM | POA: Diagnosis not present

## 2019-07-04 DIAGNOSIS — M6281 Muscle weakness (generalized): Secondary | ICD-10-CM | POA: Diagnosis not present

## 2019-07-04 DIAGNOSIS — M25462 Effusion, left knee: Secondary | ICD-10-CM | POA: Diagnosis not present

## 2019-07-04 DIAGNOSIS — M25562 Pain in left knee: Secondary | ICD-10-CM | POA: Diagnosis not present

## 2019-07-07 DIAGNOSIS — M25562 Pain in left knee: Secondary | ICD-10-CM | POA: Diagnosis not present

## 2019-07-07 DIAGNOSIS — M6281 Muscle weakness (generalized): Secondary | ICD-10-CM | POA: Diagnosis not present

## 2019-07-07 DIAGNOSIS — M25462 Effusion, left knee: Secondary | ICD-10-CM | POA: Diagnosis not present

## 2019-07-07 DIAGNOSIS — M25662 Stiffness of left knee, not elsewhere classified: Secondary | ICD-10-CM | POA: Diagnosis not present

## 2019-07-11 DIAGNOSIS — M6281 Muscle weakness (generalized): Secondary | ICD-10-CM | POA: Diagnosis not present

## 2019-07-11 DIAGNOSIS — M25462 Effusion, left knee: Secondary | ICD-10-CM | POA: Diagnosis not present

## 2019-07-11 DIAGNOSIS — M25562 Pain in left knee: Secondary | ICD-10-CM | POA: Diagnosis not present

## 2019-07-11 DIAGNOSIS — M25662 Stiffness of left knee, not elsewhere classified: Secondary | ICD-10-CM | POA: Diagnosis not present

## 2019-07-14 DIAGNOSIS — M6281 Muscle weakness (generalized): Secondary | ICD-10-CM | POA: Diagnosis not present

## 2019-07-14 DIAGNOSIS — M25662 Stiffness of left knee, not elsewhere classified: Secondary | ICD-10-CM | POA: Diagnosis not present

## 2019-07-14 DIAGNOSIS — M25562 Pain in left knee: Secondary | ICD-10-CM | POA: Diagnosis not present

## 2019-07-14 DIAGNOSIS — M25462 Effusion, left knee: Secondary | ICD-10-CM | POA: Diagnosis not present

## 2019-07-18 DIAGNOSIS — M25462 Effusion, left knee: Secondary | ICD-10-CM | POA: Diagnosis not present

## 2019-07-26 DIAGNOSIS — M25462 Effusion, left knee: Secondary | ICD-10-CM | POA: Diagnosis not present

## 2019-07-26 DIAGNOSIS — M25562 Pain in left knee: Secondary | ICD-10-CM | POA: Diagnosis not present

## 2019-07-26 DIAGNOSIS — M6281 Muscle weakness (generalized): Secondary | ICD-10-CM | POA: Diagnosis not present

## 2019-07-26 DIAGNOSIS — M25662 Stiffness of left knee, not elsewhere classified: Secondary | ICD-10-CM | POA: Diagnosis not present

## 2019-08-01 DIAGNOSIS — Z96652 Presence of left artificial knee joint: Secondary | ICD-10-CM | POA: Diagnosis not present

## 2019-08-01 DIAGNOSIS — J309 Allergic rhinitis, unspecified: Secondary | ICD-10-CM | POA: Diagnosis not present

## 2019-08-01 DIAGNOSIS — Z125 Encounter for screening for malignant neoplasm of prostate: Secondary | ICD-10-CM | POA: Diagnosis not present

## 2019-08-01 DIAGNOSIS — Z1211 Encounter for screening for malignant neoplasm of colon: Secondary | ICD-10-CM | POA: Diagnosis not present

## 2019-08-01 DIAGNOSIS — Z0000 Encounter for general adult medical examination without abnormal findings: Secondary | ICD-10-CM | POA: Diagnosis not present

## 2019-08-01 DIAGNOSIS — E782 Mixed hyperlipidemia: Secondary | ICD-10-CM | POA: Diagnosis not present

## 2019-08-01 DIAGNOSIS — N529 Male erectile dysfunction, unspecified: Secondary | ICD-10-CM | POA: Diagnosis not present

## 2019-08-01 DIAGNOSIS — Z1389 Encounter for screening for other disorder: Secondary | ICD-10-CM | POA: Diagnosis not present

## 2019-08-01 DIAGNOSIS — Z23 Encounter for immunization: Secondary | ICD-10-CM | POA: Diagnosis not present

## 2019-08-01 DIAGNOSIS — K219 Gastro-esophageal reflux disease without esophagitis: Secondary | ICD-10-CM | POA: Diagnosis not present

## 2019-08-29 DIAGNOSIS — M25511 Pain in right shoulder: Secondary | ICD-10-CM | POA: Diagnosis not present

## 2019-08-29 DIAGNOSIS — M25562 Pain in left knee: Secondary | ICD-10-CM | POA: Diagnosis not present

## 2019-10-06 DIAGNOSIS — M25511 Pain in right shoulder: Secondary | ICD-10-CM | POA: Diagnosis not present

## 2019-10-12 DIAGNOSIS — E782 Mixed hyperlipidemia: Secondary | ICD-10-CM | POA: Diagnosis not present

## 2019-10-12 DIAGNOSIS — M25511 Pain in right shoulder: Secondary | ICD-10-CM | POA: Diagnosis not present

## 2019-10-12 DIAGNOSIS — N400 Enlarged prostate without lower urinary tract symptoms: Secondary | ICD-10-CM | POA: Diagnosis not present

## 2019-10-12 DIAGNOSIS — M1711 Unilateral primary osteoarthritis, right knee: Secondary | ICD-10-CM | POA: Diagnosis not present

## 2019-10-13 DIAGNOSIS — M7541 Impingement syndrome of right shoulder: Secondary | ICD-10-CM | POA: Diagnosis not present

## 2019-10-13 DIAGNOSIS — S46011D Strain of muscle(s) and tendon(s) of the rotator cuff of right shoulder, subsequent encounter: Secondary | ICD-10-CM | POA: Diagnosis not present

## 2019-10-13 DIAGNOSIS — M25511 Pain in right shoulder: Secondary | ICD-10-CM | POA: Diagnosis not present

## 2019-10-19 DIAGNOSIS — M7541 Impingement syndrome of right shoulder: Secondary | ICD-10-CM | POA: Diagnosis not present

## 2019-10-19 DIAGNOSIS — M25511 Pain in right shoulder: Secondary | ICD-10-CM | POA: Diagnosis not present

## 2019-10-19 DIAGNOSIS — S46011D Strain of muscle(s) and tendon(s) of the rotator cuff of right shoulder, subsequent encounter: Secondary | ICD-10-CM | POA: Diagnosis not present

## 2019-10-27 DIAGNOSIS — S46011D Strain of muscle(s) and tendon(s) of the rotator cuff of right shoulder, subsequent encounter: Secondary | ICD-10-CM | POA: Diagnosis not present

## 2019-10-27 DIAGNOSIS — M7541 Impingement syndrome of right shoulder: Secondary | ICD-10-CM | POA: Diagnosis not present

## 2019-10-27 DIAGNOSIS — M25511 Pain in right shoulder: Secondary | ICD-10-CM | POA: Diagnosis not present

## 2019-11-01 DIAGNOSIS — M7541 Impingement syndrome of right shoulder: Secondary | ICD-10-CM | POA: Diagnosis not present

## 2019-11-01 DIAGNOSIS — S46011D Strain of muscle(s) and tendon(s) of the rotator cuff of right shoulder, subsequent encounter: Secondary | ICD-10-CM | POA: Diagnosis not present

## 2019-11-01 DIAGNOSIS — M25511 Pain in right shoulder: Secondary | ICD-10-CM | POA: Diagnosis not present

## 2019-11-27 DIAGNOSIS — Z23 Encounter for immunization: Secondary | ICD-10-CM | POA: Diagnosis not present

## 2019-12-04 DIAGNOSIS — H2513 Age-related nuclear cataract, bilateral: Secondary | ICD-10-CM | POA: Diagnosis not present

## 2020-01-10 DIAGNOSIS — L570 Actinic keratosis: Secondary | ICD-10-CM | POA: Diagnosis not present

## 2020-01-10 DIAGNOSIS — L821 Other seborrheic keratosis: Secondary | ICD-10-CM | POA: Diagnosis not present

## 2020-01-10 DIAGNOSIS — D485 Neoplasm of uncertain behavior of skin: Secondary | ICD-10-CM | POA: Diagnosis not present

## 2020-01-10 DIAGNOSIS — D225 Melanocytic nevi of trunk: Secondary | ICD-10-CM | POA: Diagnosis not present

## 2020-01-10 DIAGNOSIS — D2262 Melanocytic nevi of left upper limb, including shoulder: Secondary | ICD-10-CM | POA: Diagnosis not present

## 2020-06-10 IMAGING — CR DG SHOULDER 2+V*R*
3 series · 3 of 3 positions shown · non-contrast
Comparison: None.

CLINICAL DATA: Right shoulder pain for 1 month

EXAM:
RIGHT SHOULDER - 2+ VIEW

[w shoulder grashey right]
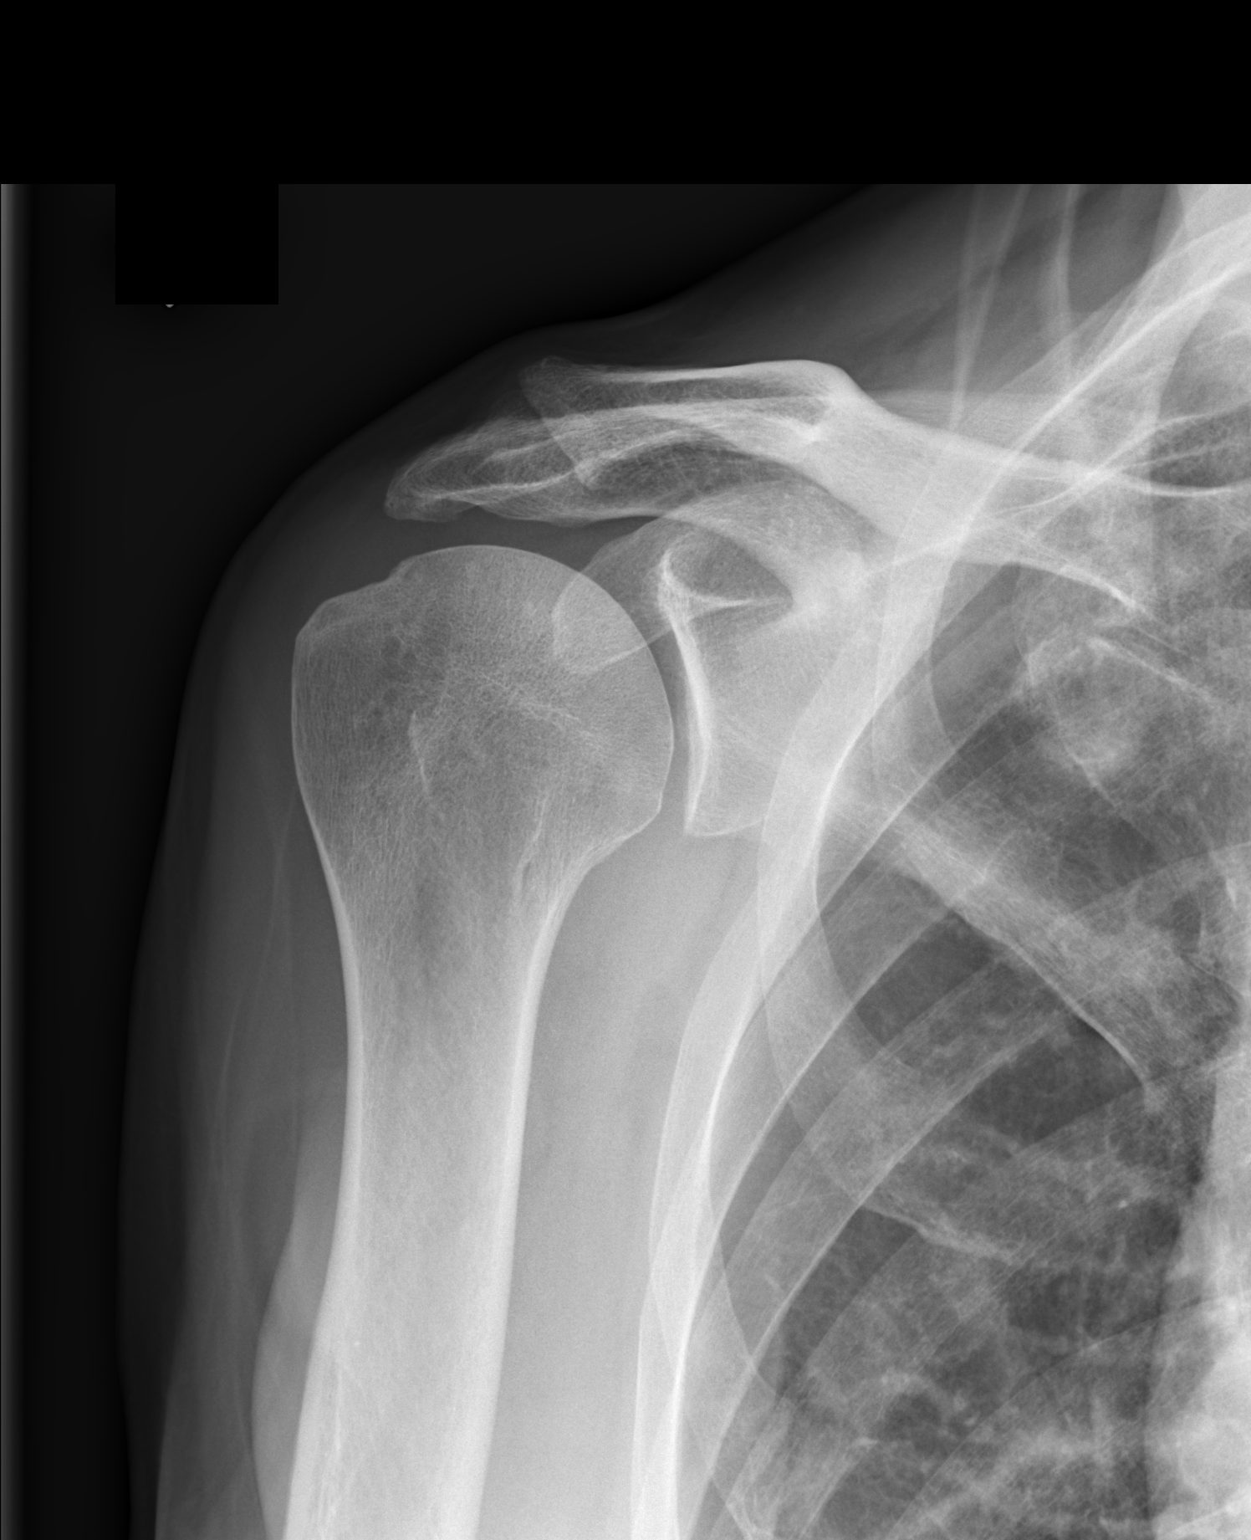

[w shoulder y-view right]
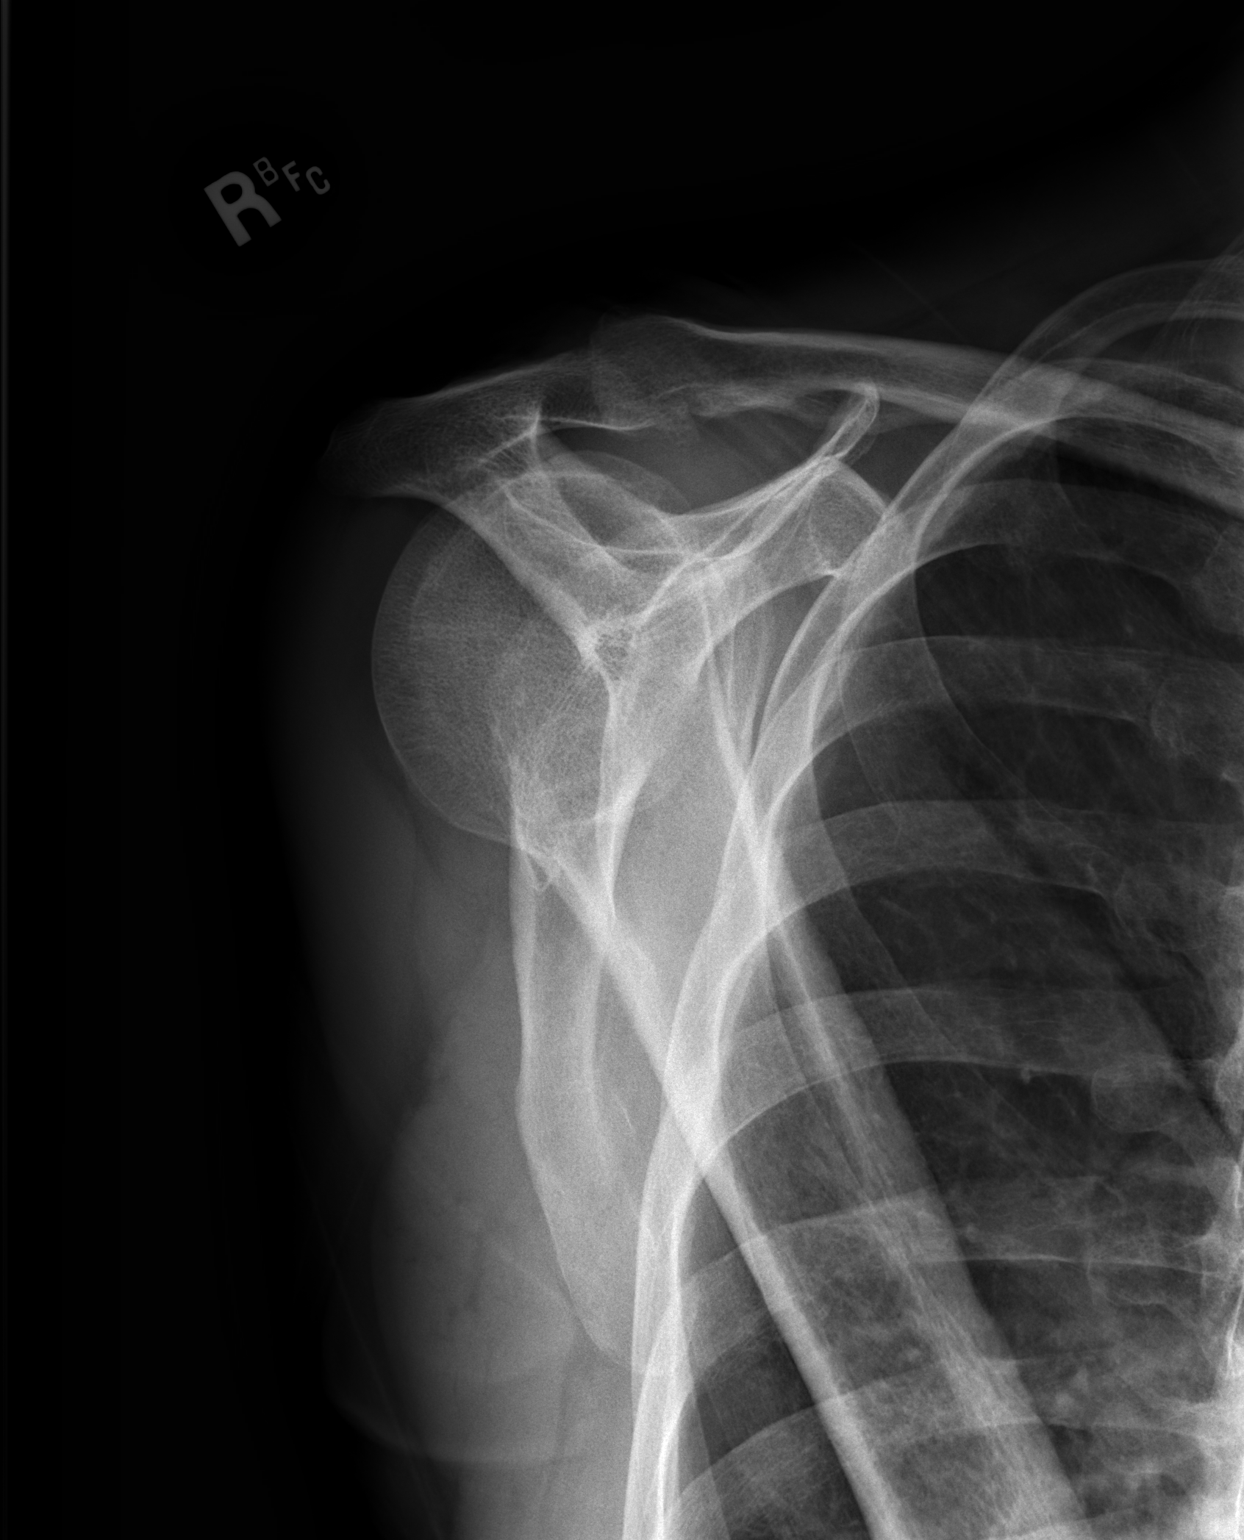

[w shoulder axillary right]
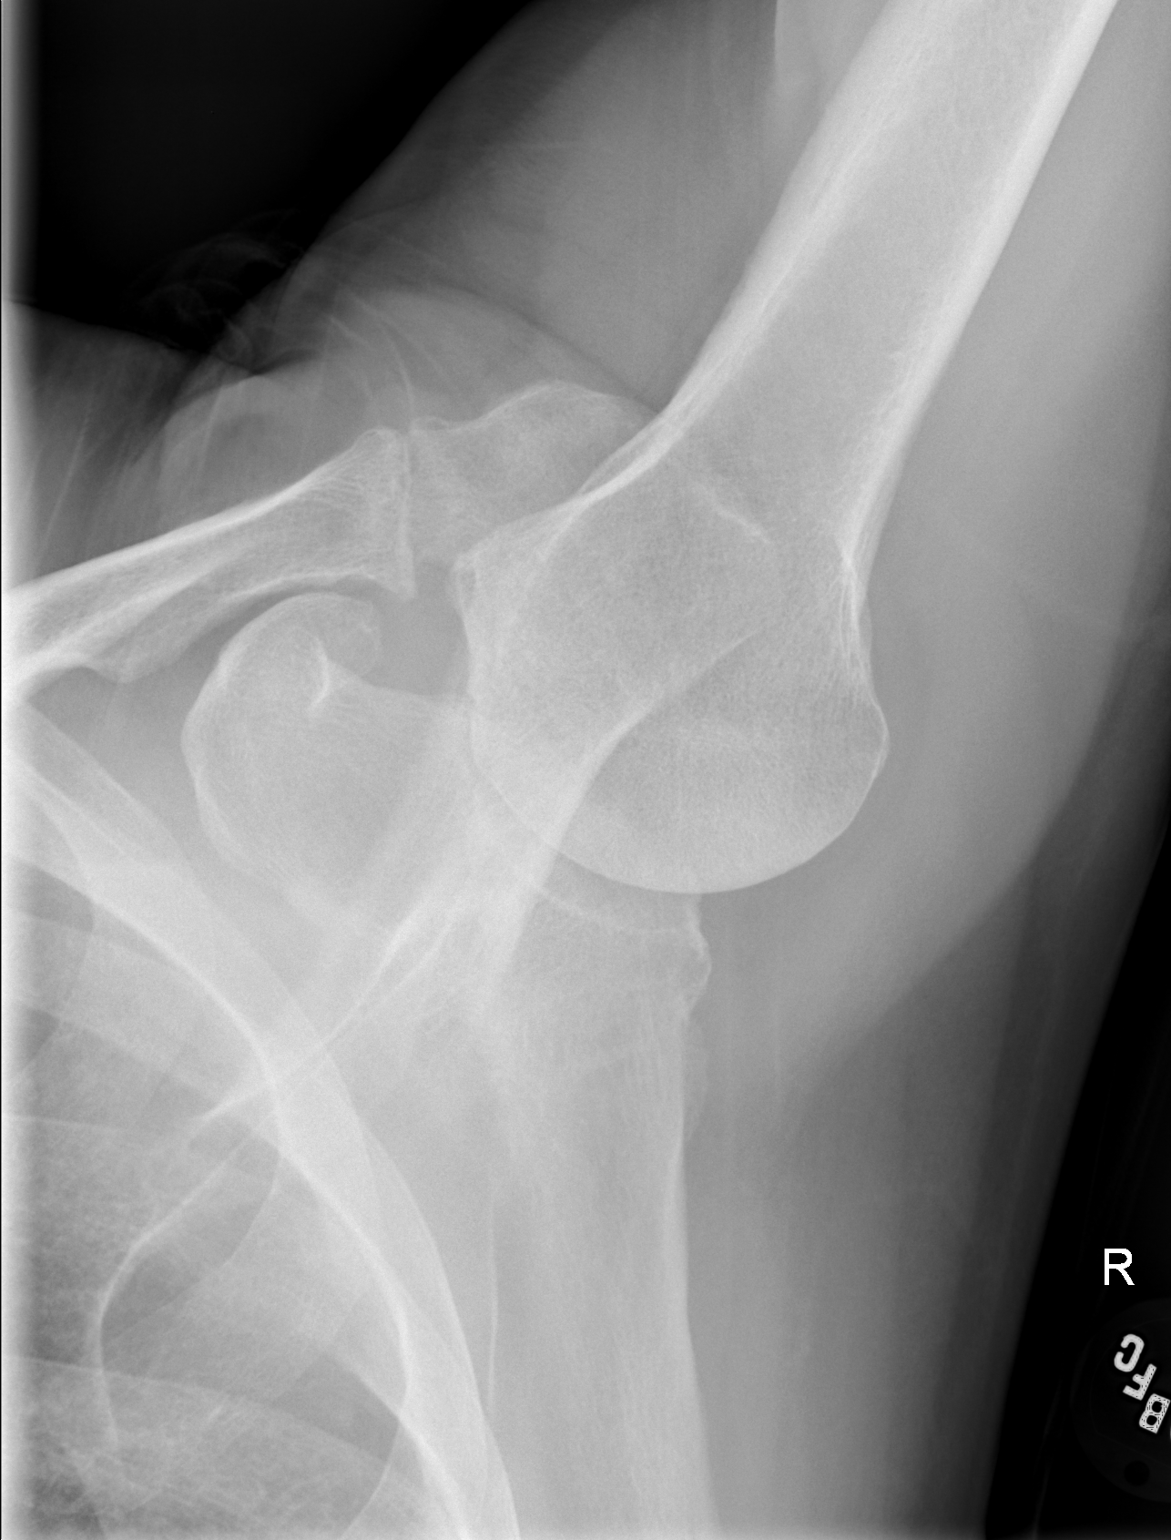

[3 of 3 positions shown; findings below may reference images not displayed]

FINDINGS: Degenerative changes of the acromioclavicular joint are noted. No
acute fracture or dislocation is noted. No soft tissue abnormality
is seen.
IMPRESSION: No acute abnormality noted.

## 2021-01-30 ENCOUNTER — Other Ambulatory Visit: Payer: Self-pay

## 2021-01-30 DIAGNOSIS — I714 Abdominal aortic aneurysm, without rupture, unspecified: Secondary | ICD-10-CM

## 2021-02-17 NOTE — Progress Notes (Signed)
[facility] [place]  ASSESSMENT / PLAN: Brian Rush is a 75 y.o. male with an ascending aortic aneurysm measuring 74mm in greatest dimension.  He has no evidence of infrarenal abdominal aortic aneurysm on duplex imaging today.  I counseled him that ascending aortic aneurysm is typically treated by cardiac surgery.  I reviewed his very low risk of rupture with him.  And counseled him about the natural history of aneurysm disease.  He is understanding of the above.  He is also suffered a DVT and PE, which is being appropriately managed with a 60-month course of Eliquis.  I encouraged him to follow-up with the cardiac surgeons in the next 12 to 18 months for continued surveillance.  He can follow-up with me as needed.  CHIEF COMPLAINT: Aneurysm on CT scan  HISTORY OF PRESENT ILLNESS: Brian Rush is a 75 y.o. male referred to clinic for evaluation of an aneurysm demonstrated on the CT angiogram of the chest.  This was done for evaluation of right lower extremity DVT and identified subsegmental pulmonary embolism as well as a 45 mm ascending aortic aneurysm.  Patient is asymptomatic from his DVT and PEs.  He is on Eliquis.  He has been counseled to continue this for 6 months.  We do lengthy discussion about the natural history of aneurysm disease.  I reviewed the typical growth pattern of aneurysms.  I counseled him that a cardiac surgeon is the typical physician to treat an ascending aortic aneurysm.  Past Medical History:  Diagnosis Date   Allergy    Arthritis    GERD (gastroesophageal reflux disease)    Headache    Yves Dill Parkinson White pattern seen on electrocardiogram     Past Surgical History:  Procedure Laterality Date   JOINT REPLACEMENT Right    2011   TOTAL KNEE ARTHROPLASTY Left 06/02/2019   Procedure: TOTAL KNEE ARTHROPLASTY;  Surgeon: Earlie Server, MD;  Location: [facility];  Service: Orthopedics;  Laterality: Left;    History reviewed. No  pertinent family history.  Social History   Socioeconomic History   Marital status: Married    Spouse name: Not on file   Number of children: Not on file   Years of education: Not on file   Highest education level: Not on file  Occupational History   Not on file  Tobacco Use   Smoking status: Never   Smokeless tobacco: Never  Substance and Sexual Activity   Alcohol use: Yes    Alcohol/week: 1.0 standard drink    Types: 1 Glasses of wine per week    Comment: 4 glasses per week   Drug use: Never   Sexual activity: Not on file  Other Topics Concern   Not on file  Social History Narrative   Not on file   Social Determinants of Health   Financial Resource Strain: Not on file  Food Insecurity: Not on file  Transportation Needs: Not on file  Physical Activity: Not on file  Stress: Not on file  Social Connections: Not on file  Intimate Partner Violence: Not on file    Allergies  Allergen Reactions   Latex Other (See Comments)    Latex bandage--skin burn around the perimeter    Current Outpatient Medications  Medication Sig Dispense Refill   Cholecalciferol (VITAMIN D3) 50 MCG (2000 UT) TABS Take 2,000 Units by mouth daily.     Coenzyme Q10 (COQ10) 100 MG CAPS Take 100 mg by mouth daily.  ELIQUIS 5 MG TABS tablet Take 5 mg by mouth 2 (two) times daily.     fluticasone (FLONASE) 50 MCG/ACT nasal spray Place 1-2 sprays into both nostrils daily as needed for allergies or rhinitis.     ketotifen (ZADITOR) 0.025 % ophthalmic solution Place 1 drop into both eyes 2 (two) times daily as needed (allergies.).     loratadine (CLARITIN) 10 MG tablet Take 10 mg by mouth daily as needed for allergies.     Multiple Vitamin (MULTIVITAMIN WITH MINERALS) TABS tablet Take 1 tablet by mouth daily.     Omega-3 1000 MG CAPS Take 1,000 mg by mouth daily.     oxymetazoline (AFRIN) 0.05 % nasal spray Place 1 spray into both nostrils 2 (two) times daily as needed for congestion.      Phenyleph-Doxylamine-DM-APAP (DAYTIME/NIGHTTIME PO) Take 1-2 tablets by mouth 3 (three) times daily as needed (cold symptoms).     phenylephrine (SUDAFED PE) 10 MG TABS tablet Take 10 mg by mouth every 4 (four) hours as needed (congestion.).     Polyethyl Glycol-Propyl Glycol (LUBRICANT EYE DROPS) 0.4-0.3 % SOLN Place 1-2 drops into both eyes 3 (three) times daily as needed (dry/irritated eyes.).     rosuvastatin (CRESTOR) 10 MG tablet Take 10 mg by mouth at bedtime.      sildenafil (REVATIO) 20 MG tablet Take 60-80 mg by mouth daily as needed (erectile dysfunction.).     No current facility-administered medications for this visit.    REVIEW OF SYSTEMS:  [X]  denotes positive finding, [ ]  denotes negative finding Cardiac  Comments:  Chest pain or chest pressure:    Shortness of breath upon exertion:    Short of breath when lying flat:    Irregular heart rhythm:        Vascular    Pain in calf, thigh, or hip brought on by ambulation:    Pain in feet at night that wakes you up from your sleep:     Blood clot in your veins:    Leg swelling:         Pulmonary    Oxygen at home:    Productive cough:     Wheezing:         Neurologic    Sudden weakness in arms or legs:     Sudden numbness in arms or legs:     Sudden onset of difficulty speaking or slurred speech:    Temporary loss of vision in one eye:     Problems with dizziness:         Gastrointestinal    Blood in stool:     Vomited blood:         Genitourinary    Burning when urinating:     Blood in urine:        Psychiatric    Major depression:         Hematologic    Bleeding problems:    Problems with blood clotting too easily:        Skin    Rashes or ulcers:        Constitutional    Fever or chills:      PHYSICAL EXAM Vitals:   02/18/21 0902  BP: 137/81  Pulse: 62  Resp: 20  Temp: 100 F (37.8 C)  SpO2: 99%  Weight: 209 lb (94.8 kg)  Height: 6' 5.5" (1.969 m)    Constitutional: well appearing. no  distress. Appears well nourished.  Neurologic: CN intact. no focal findings.  no sensory loss. Psychiatric:  Mood and affect symmetric and appropriate. Eyes:  No icterus. No conjunctival pallor. Ears, nose, throat:  mucous membranes moist. Midline trachea.  Cardiac: regular rate and rhythm.  Respiratory:  unlabored. Abdominal:  soft, non-tender, non-distended.  Peripheral vascular: 2+ radial pulses; 2+ DP pulses. Chronic venous insufficiency with reticular veins, varicosities. Extremity: Mild RLE edema. no cyanosis. no pallor.  Skin: no gangrene. no ulceration.  Lymphatic: no Stemmer's sign. no palpable lymphadenopathy.  PERTINENT LABORATORY AND RADIOLOGIC DATA  Most recent CBC CBC Latest Ref Rng & Units 05/22/2019 08/23/2009 08/22/2009  WBC 4.0 - 10.5 K/uL 6.9 11.7(H) 9.8  Hemoglobin 13.0 - 17.0 g/dL 14.5 9.5(L) 10.5(L)  Hematocrit 39.0 - 52.0 % 44.2 28.2(L) 30.8(L)  Platelets 150 - 400 K/uL 205 136(L) 159     Most recent CMP CMP Latest Ref Rng & Units 05/22/2019 08/23/2009 08/22/2009  Glucose 70 - 99 mg/dL 104(H) 130(H) 109(H)  BUN 8 - 23 mg/dL 19 7 11   Creatinine 0.61 - 1.24 mg/dL 0.81 0.88 1.03  Sodium 135 - 145 mmol/L 142 137 137  Potassium 3.5 - 5.1 mmol/L 4.7 4.2 4.1  Chloride 98 - 111 mmol/L 108 101 101  CO2 22 - 32 mmol/L 28 30 33(H)  Calcium 8.9 - 10.3 mg/dL 9.2 8.2(L) 8.1(L)  Total Protein 6.5 - 8.1 g/dL 6.5 - -  Total Bilirubin 0.3 - 1.2 mg/dL 0.8 - -  Alkaline Phos 38 - 126 U/L 45 - -  AST 15 - 41 U/L 28 - -  ALT 0 - 44 U/L 26 - -   Outside imaging report reviewed.  The images were not available for my personal review.  45 mm ascending aortic aneurysm reported by the reading radiologist.  AAA duplex No prior aorta duplex. The largest diamater visualized was in the proximal  aorta measuring approximately 3.06 x 2.56 cm.   Yevonne Aline. Stanford Breed, MD [facility] Phone Number: [phone] 02/18/2021 10:21 AM  Total time spent on  preparing this encounter including chart review, data review, collecting history, examining the patient, coordinating care for this new patient, 60 minutes.  Portions of this report may have been transcribed using voice recognition software.  Every effort has been made to ensure accuracy; however, inadvertent computerized transcription errors may still be present.

## 2021-02-18 ENCOUNTER — Encounter: Payer: Self-pay | Admitting: Vascular Surgery

## 2021-02-18 ENCOUNTER — Other Ambulatory Visit: Payer: Self-pay

## 2021-02-18 ENCOUNTER — Ambulatory Visit (INDEPENDENT_AMBULATORY_CARE_PROVIDER_SITE_OTHER): Payer: Medicare Other | Admitting: Vascular Surgery

## 2021-02-18 ENCOUNTER — Ambulatory Visit (HOSPITAL_COMMUNITY)
Admission: RE | Admit: 2021-02-18 | Discharge: 2021-02-18 | Disposition: A | Discharge status: Home / Self Care | Payer: Medicare Other | Source: Ambulatory Visit | Attending: Vascular Surgery | Admitting: Vascular Surgery

## 2021-02-18 VITALS — BP 137/81 | HR 62 | Temp 100.0°F | Resp 20 | Ht 77.5 in | Wt 209.0 lb

## 2021-02-18 DIAGNOSIS — I7121 Aneurysm of the ascending aorta, without rupture: Secondary | ICD-10-CM | POA: Diagnosis not present

## 2021-02-18 DIAGNOSIS — I82401 Acute embolism and thrombosis of unspecified deep veins of right lower extremity: Secondary | ICD-10-CM

## 2021-02-18 DIAGNOSIS — I714 Abdominal aortic aneurysm, without rupture, unspecified: Secondary | ICD-10-CM

## 2021-02-18 DIAGNOSIS — I2699 Other pulmonary embolism without acute cor pulmonale: Secondary | ICD-10-CM | POA: Diagnosis not present

## 2021-07-30 NOTE — Patient Instructions (Signed)
Patient is counseled regarding the importance of long term risk factor modification as they pertain to the presence of ischemic heart disease including avoiding the use of all tobacco products, dietary modifications and medical therapy for diabetes, cholesterol and lipid management, and regular exercise.     Make every effort to maintain a "heart-healthy" lifestyle with regular physical exercise and adherence to a low-fat, low-carbohydrate diet.  Continue to seek regular follow-up appointments with your primary care physician and/or cardiologist.   AVOID FLUOROQUINOLONES WHICH ARE ANTIBIOTICS AND THEY CAN INCREASE YOUR RISK OF DISSECTION

## 2021-07-30 NOTE — Progress Notes (Addendum)
[address]       [place] [address]             [phone]        Brian Rush [id number] May 07, 1945  History of Present Illness:  Brian Rush is a 76 yo male with history of DVT, GERD, HLD, Allergies, and Ascending Aortic Aneurysm measuring 45 mm in diameter.  The patient is healthy overall.  He states that he does have a history of HLD for which he takes a statin.  He did not know he had an aneurysm.  He states that he drove to [place] and when he arrived there he noticed his right ankle was very swollen.  He was noted to have a DVT and the ED physician felt he should have a CT scan to check for a PE.  He was incidentally found to have a 4.5 cm aneurysm.  Upon return to [place] he followed up with Dr. Standley Dakins who recommended the patient be evaluated in office.  Currently the patient is doing well.  He continues to have some swelling in his RLE.  He is no longer taking Eliquis.  He denies chest pain, shortness of breath, or fatigue.  He has never smoked.  He denies family history of aneurysmal disease.  He remains fairly active.  Current Outpatient Medications on File Prior to Visit  Medication Sig Dispense Refill   Cholecalciferol (VITAMIN D3) 50 MCG (2000 UT) TABS Take 2,000 Units by mouth daily.     Coenzyme Q10 (COQ10) 100 MG CAPS Take 100 mg by mouth daily.     fluticasone (FLONASE) 50 MCG/ACT nasal spray Place 1-2 sprays into both nostrils daily as needed for allergies or rhinitis.     ketotifen (ZADITOR) 0.025 % ophthalmic solution Place 1 drop into both eyes 2 (two) times daily as needed (allergies.).     loratadine (CLARITIN) 10 MG tablet Take 10 mg by mouth daily as needed for allergies.     Multiple Vitamin (MULTIVITAMIN WITH MINERALS) TABS tablet Take 1 tablet by mouth daily.     Omega-3 1000 MG CAPS Take 1,000 mg by mouth daily.     oxymetazoline (AFRIN) 0.05 % nasal spray Place 1 spray into both nostrils 2 (two) times daily as needed  for congestion.     Phenyleph-Doxylamine-DM-APAP (DAYTIME/NIGHTTIME PO) Take 1-2 tablets by mouth 3 (three) times daily as needed (cold symptoms).     phenylephrine (SUDAFED PE) 10 MG TABS tablet Take 10 mg by mouth every 4 (four) hours as needed (congestion.).     Polyethyl Glycol-Propyl Glycol (LUBRICANT EYE DROPS) 0.4-0.3 % SOLN Place 1-2 drops into both eyes 3 (three) times daily as needed (dry/irritated eyes.).     rosuvastatin (CRESTOR) 10 MG tablet Take 10 mg by mouth at bedtime.      sildenafil (REVATIO) 20 MG tablet Take 60-80 mg by mouth daily as needed (erectile dysfunction.).     ELIQUIS 5 MG TABS tablet Take 5 mg by mouth 2 (two) times daily.     No current facility-administered medications on file prior to visit.     BP 138/73   Pulse 67   Resp 20   Ht 6' 5.5" (1.969 m)   Wt 215 lb (97.5 kg)   SpO2 97% Comment: RA  BMI 25.17 kg/m   Physical Exam  Gen: NAD Heart: RRR Lungs: CTA bilatreally Abd: soft non-tender, non-distended Neck: no carotid bruit Ext: mild edema, R>L Neuro: grossly  intact  CTA Results:  CT scan reviewed from 12/25/2020 with a 4.5 cm Ascending Aortic Aneurysm.  A/P:  Ascending Aortic Aneurysm measuring 4.5 cm, incidentally found on ED visit back in November while undergoing workup for LE DVT.  He has since stopped his Eliquis HLD- on crestor No history of HTN- patients BP was mildly elevated today at 138/73, I suspect some of this is related to new patient visit.  Patient states BP normally runs in the 120s.  Patient educated on BP surveillance and initiating antihypertensive therapy if indicated Activity- discussed with patient activity level and avoidance of power lifting and high intensity training RTC in November with CTA chest to assess aneurysm, he was instructed to contact our office sooner should the need arise.  Risk Modification:  Statin:  Yes, HLD  Smoking cessation instruction/counseling given:  Never smoker  Patient was  counseled on importance of Blood Pressure Control.  Despite Medical intervention if the patient notices persistently elevated blood pressure readings.  They are instructed to contact their Primary Care Physician  Please avoid use of Fluoroquinolones as this can potentially increase your risk of Aortic Rupture and/or Dissection  Patient educated on signs and symptoms of Aortic Dissection, handout also provided in AVS  Ac Colan, PA-C 07/31/21

## 2021-07-31 ENCOUNTER — Institutional Professional Consult (permissible substitution): Payer: Medicare Other | Admitting: Physician Assistant

## 2021-07-31 VITALS — BP 138/73 | HR 67 | Resp 20 | Ht 77.5 in | Wt 215.0 lb

## 2021-07-31 DIAGNOSIS — N400 Enlarged prostate without lower urinary tract symptoms: Secondary | ICD-10-CM | POA: Insufficient documentation

## 2021-07-31 DIAGNOSIS — N529 Male erectile dysfunction, unspecified: Secondary | ICD-10-CM | POA: Insufficient documentation

## 2021-07-31 DIAGNOSIS — R202 Paresthesia of skin: Secondary | ICD-10-CM | POA: Insufficient documentation

## 2021-07-31 DIAGNOSIS — I7121 Aneurysm of the ascending aorta, without rupture: Secondary | ICD-10-CM | POA: Diagnosis not present

## 2021-07-31 DIAGNOSIS — K219 Gastro-esophageal reflux disease without esophagitis: Secondary | ICD-10-CM | POA: Insufficient documentation

## 2021-07-31 DIAGNOSIS — E782 Mixed hyperlipidemia: Secondary | ICD-10-CM | POA: Insufficient documentation

## 2021-07-31 DIAGNOSIS — I719 Aortic aneurysm of unspecified site, without rupture: Secondary | ICD-10-CM | POA: Insufficient documentation

## 2021-07-31 DIAGNOSIS — Z96652 Presence of left artificial knee joint: Secondary | ICD-10-CM | POA: Insufficient documentation

## 2021-07-31 DIAGNOSIS — F4320 Adjustment disorder, unspecified: Secondary | ICD-10-CM | POA: Insufficient documentation

## 2021-07-31 DIAGNOSIS — J309 Allergic rhinitis, unspecified: Secondary | ICD-10-CM | POA: Insufficient documentation

## 2021-12-02 ENCOUNTER — Other Ambulatory Visit: Payer: Self-pay | Admitting: Thoracic Surgery (Cardiothoracic Vascular Surgery)

## 2021-12-02 DIAGNOSIS — I7121 Aneurysm of the ascending aorta, without rupture: Secondary | ICD-10-CM

## 2022-01-08 ENCOUNTER — Ambulatory Visit
Admission: RE | Admit: 2022-01-08 | Discharge: 2022-01-08 | Disposition: A | Discharge status: Home / Self Care | Payer: Medicare Other | Source: Ambulatory Visit | Attending: Thoracic Surgery (Cardiothoracic Vascular Surgery) | Admitting: Thoracic Surgery (Cardiothoracic Vascular Surgery)

## 2022-01-08 DIAGNOSIS — I7121 Aneurysm of the ascending aorta, without rupture: Secondary | ICD-10-CM

## 2022-01-08 MED ORDER — IOPAMIDOL (ISOVUE-370) INJECTION 76%
75.0000 mL | Freq: Once | INTRAVENOUS | Status: AC | PRN
  Administered 2022-01-08: 75 mL via INTRAVENOUS

## 2022-01-13 ENCOUNTER — Ambulatory Visit: Payer: Medicare Other | Admitting: Thoracic Surgery (Cardiothoracic Vascular Surgery)

## 2022-01-20 ENCOUNTER — Ambulatory Visit: Payer: Medicare Other | Admitting: Thoracic Surgery (Cardiothoracic Vascular Surgery)

## 2022-01-20 ENCOUNTER — Encounter: Payer: Self-pay | Admitting: Thoracic Surgery (Cardiothoracic Vascular Surgery)

## 2022-01-20 VITALS — BP 133/76 | HR 64 | Resp 20 | Ht 77.0 in | Wt 211.0 lb

## 2022-01-20 DIAGNOSIS — I2694 Multiple subsegmental pulmonary emboli without acute cor pulmonale: Secondary | ICD-10-CM

## 2022-01-20 DIAGNOSIS — I2699 Other pulmonary embolism without acute cor pulmonale: Secondary | ICD-10-CM | POA: Insufficient documentation

## 2022-01-20 DIAGNOSIS — I7121 Aneurysm of the ascending aorta, without rupture: Secondary | ICD-10-CM | POA: Diagnosis not present

## 2022-01-20 NOTE — Progress Notes (Signed)
[address]       [place] [address]             [phone]     HPI: [name] returns for follow-up of his ascending aneurysm  Brian Rush is a 76 year old gentleman with a past history significant for hyperlipidemia, DVT, PE, reflux, BPH, WPW, and an ascending aneurysm discovered in 2022.  About a year ago he had swelling in his legs.  He was diagnosed with a DVT.  He also had had some mild respiratory issues so a chest CT was done.  There was no pulmonary embolus but there was a 4.4 cm ascending aneurysm.  He recently had flown to [place].  He had an uneventful trip from a medical standpoint.  He had a CT to follow-up his ascending aneurysm and was noted to have small bilateral pulmonary emboli.  He was started on Eliquis.  He is not having any chest pain, pressure, tightness, or shortness of breath.  Exercise tolerance is excellent.  Past Medical History:  Diagnosis Date   Allergy    Arthritis    GERD (gastroesophageal reflux disease)    Headache    Brian Rush Parkinson White pattern seen on electrocardiogram     Current Outpatient Medications  Medication Sig Dispense Refill   Cholecalciferol (VITAMIN D3) 50 MCG (2000 UT) TABS Take 2,000 Units by mouth daily.     Coenzyme Q10 (COQ10) 100 MG CAPS Take 100 mg by mouth daily.     fluticasone (FLONASE) 50 MCG/ACT nasal spray Place 1-2 sprays into both nostrils daily as needed for allergies or rhinitis.     ketotifen (ZADITOR) 0.025 % ophthalmic solution Place 1 drop into both eyes 2 (two) times daily as needed (allergies.).     loratadine (CLARITIN) 10 MG tablet Take 10 mg by mouth daily as needed for allergies.     Multiple Vitamin (MULTIVITAMIN WITH MINERALS) TABS tablet Take 1 tablet by mouth daily.     Omega-3 1000 MG CAPS Take 1,000 mg by mouth daily.     oxymetazoline (AFRIN) 0.05 % nasal spray Place 1 spray into both nostrils 2 (two) times daily as needed for congestion.      Phenyleph-Doxylamine-DM-APAP (DAYTIME/NIGHTTIME PO) Take 1-2 tablets by mouth 3 (three) times daily as needed (cold symptoms).     phenylephrine (SUDAFED PE) 10 MG TABS tablet Take 10 mg by mouth every 4 (four) hours as needed (congestion.).     Polyethyl Glycol-Propyl Glycol (LUBRICANT EYE DROPS) 0.4-0.3 % SOLN Place 1-2 drops into both eyes 3 (three) times daily as needed (dry/irritated eyes.).     rosuvastatin (CRESTOR) 10 MG tablet Take 10 mg by mouth at bedtime.      sildenafil (REVATIO) 20 MG tablet Take 60-80 mg by mouth daily as needed (erectile dysfunction.).     ELIQUIS 5 MG TABS tablet Take 5 mg by mouth 2 (two) times daily.     No current facility-administered medications for this visit.    Physical Exam BP 133/76 (BP Location: Left Arm, Patient Position: Sitting, Cuff Size: Normal)   Pulse 64   Resp 20   Ht '6\' 5"'$  (1.956 m)   Wt 211 lb (95.7 kg)   SpO2 95% Comment: RA  BMI 25.54 kg/m  76 year old man in no acute distress Well-developed and well-nourished Alert and oriented x3 with no focal deficits Lungs clear with equal breath sounds bilaterally No carotid bruits Cardiac regular rate and rhythm with normal S1  and S2   Diagnostic Tests: CT ANGIOGRAPHY CHEST WITH CONTRAST   TECHNIQUE: Multidetector CT imaging of the chest was performed using the standard protocol during bolus administration of intravenous contrast. Multiplanar CT image reconstructions and MIPs were obtained to evaluate the vascular anatomy.   RADIATION DOSE REDUCTION: This exam was performed according to the departmental dose-optimization program which includes automated exposure control, adjustment of the mA and/or kV according to patient size and/or use of iterative reconstruction technique.   CONTRAST:  70m ISOVUE-370 IOPAMIDOL (ISOVUE-370) INJECTION 76%   COMPARISON:  Chest radiograph 08/15/2009   FINDINGS: Cardiovascular: Aortic valve calcification noted. Ascending thoracic aortic  aneurysm 4.4 cm in diameter on image 58 series 4. Coronary, aortic arch, and branch vessel atherosclerotic vascular disease. Borderline cardiomegaly.   Today's exam was not optimized to assess the pulmonary arteries. That said, there appears to be unambiguous filling defect in segmental branches of the right lower lobe for example on image 75 series 4. One of these is slightly peripheral but another is fairly central, and thrombus extends into subsegmental branches in this region.   Involvement in the left lower lobe is more questionable, there is a possible segmental thrombus on image 66 of series 4 in the left lower lobe although this is less distinctly visualized. Suspected subsegmental left upper lobe pulmonary embolus for example on image 203 series 5 and image 168 series 5.   No lobar level thrombus is identified and accordingly this is not felt to rise to the level of potential submassive pulmonary embolus.   Mediastinum/Nodes: Hypodense right thyroid nodules up to 1.4 cm in long axis. Not clinically significant; no follow-up imaging recommended (ref: J Am Coll Radiol. 2015 Feb;12(2): 143-50).   Calcified subcarinal, right hilar, and right lower paratracheal nodes compatible with old granulomatous disease.   Lungs/Pleura: Biapical pleuroparenchymal scarring. Dense benign calcified granuloma in the right middle lobe on image 119 series 7. Mild scarring or subsegmental atelectasis noted in the lingula left lower lobe.   Upper Abdomen: We partially image a Bosniak category 1 cyst measuring 2.2 cm anteriorly in the right kidney. No further imaging workup of this lesion is indicated.   Abdominal aortic atherosclerosis.   Musculoskeletal: Mild upper thoracic spondylosis.   Review of the MIP images confirms the above findings.   IMPRESSION: 1. Segmental and subsegmental pulmonary emboli in the right lower lobe and subsegmental pulmonary emboli in the left upper lobe.  No lobar level thrombus is identified and accordingly this is not felt to be rise to the level of potential submassive pulmonary embolus. Please note that today's exam was not optimized to assess the pulmonary arteries. 2. Aortic valve calcification, query aortic stenosis. Ascending thoracic aortic aneurysm 4.4 cm in diameter. Recommend annual imaging followup by CTA or MRA. This recommendation follows 2010 ACCF/AHA/AATS/ACR/ASA/SCA/SCAI/SIR/STS/SVM Guidelines for the Diagnosis and Management of Patients with Thoracic Aortic Disease. Circulation. 2010; 121:: O709-G283 Aortic aneurysm NOS (ICD10-I71.9) 3. Old granulomatous disease. 4. Borderline cardiomegaly. 5. Mild scarring or subsegmental atelectasis in the lingula and left lower lobe. 6. Bosniak category 1 cyst anteriorly in the right kidney. 7. Aortic atherosclerosis.   Aortic Atherosclerosis (ICD10-I70.0).   Critical Value/emergent results were called by telephone at the time of interpretation on 01/08/2022 at 10:43 am to provider Brian Rush who verbally acknowledged these results. We specifically discussed that to would be appropriate for Brian Rush to proceed to the nearest emergency department for assessment and care given the unexpected pulmonary embolus.     Electronically  Signed   By: Brian Rush M.D.   On: 01/08/2022 10:48 I personally reviewed the CT images.  There is a 4.4 cm ascending aneurysm.  Segmental and subsegmental pulmonary emboli as noted by the radiologist.  Impression: Brian Rush is a 76 year old gentleman with a past history significant for hyperlipidemia, DVT, PE, reflux, BPH, WPW, and an ascending aneurysm discovered in 2022.  Ascending aneurysm-stable at 4.4 cm.  Needs continued annual follow-up.  His blood pressure is slightly above optimal at 629 systolic today.  He does not have any history of hypertension.  Should continue to monitor.  Pulmonary emboli-asymptomatic.  Started  on Eliquis.  Likely due to travel to [place].  Does have a history of a previous DVT a year ago.  Given his likely recurrent DVT/PE should probably remain on blood thinners for life unless there is a contraindication.  Plan: Return in 1 year with CT angio of chest  Melrose Nakayama, MD [facility] [phone]

## 2022-02-12 ENCOUNTER — Inpatient Hospital Stay: Payer: Medicare Other

## 2022-02-12 ENCOUNTER — Other Ambulatory Visit: Payer: Self-pay

## 2022-02-12 ENCOUNTER — Inpatient Hospital Stay: Payer: Medicare Other | Attending: Hematology and Oncology | Admitting: Hematology and Oncology

## 2022-02-12 VITALS — BP 139/77 | HR 69 | Resp 17 | Wt 213.1 lb

## 2022-02-12 DIAGNOSIS — K219 Gastro-esophageal reflux disease without esophagitis: Secondary | ICD-10-CM | POA: Insufficient documentation

## 2022-02-12 DIAGNOSIS — I2694 Multiple subsegmental pulmonary emboli without acute cor pulmonale: Secondary | ICD-10-CM | POA: Diagnosis not present

## 2022-02-12 DIAGNOSIS — Z86718 Personal history of other venous thrombosis and embolism: Secondary | ICD-10-CM | POA: Diagnosis not present

## 2022-02-12 DIAGNOSIS — Z8249 Family history of ischemic heart disease and other diseases of the circulatory system: Secondary | ICD-10-CM | POA: Insufficient documentation

## 2022-02-12 DIAGNOSIS — Z7901 Long term (current) use of anticoagulants: Secondary | ICD-10-CM | POA: Insufficient documentation

## 2022-02-12 DIAGNOSIS — Z79899 Other long term (current) drug therapy: Secondary | ICD-10-CM | POA: Diagnosis not present

## 2022-02-12 DIAGNOSIS — Z7289 Other problems related to lifestyle: Secondary | ICD-10-CM | POA: Diagnosis not present

## 2022-02-12 DIAGNOSIS — Z86711 Personal history of pulmonary embolism: Secondary | ICD-10-CM | POA: Insufficient documentation

## 2022-02-12 DIAGNOSIS — Z800 Family history of malignant neoplasm of digestive organs: Secondary | ICD-10-CM | POA: Insufficient documentation

## 2022-02-12 LAB — CBC WITH DIFFERENTIAL (CANCER CENTER ONLY)
Abs Immature Granulocytes: 0.02 10*3/uL (ref 0.00–0.07)
Basophils Absolute: 0.1 10*3/uL (ref 0.0–0.1)
Basophils Relative: 1 %
Eosinophils Absolute: 0.1 10*3/uL (ref 0.0–0.5)
Eosinophils Relative: 1 %
HCT: 46.5 % (ref 39.0–52.0)
Hemoglobin: 15.5 g/dL (ref 13.0–17.0)
Immature Granulocytes: 0 %
Lymphocytes Relative: 20 %
Lymphs Abs: 1.3 10*3/uL (ref 0.7–4.0)
MCH: 32.6 pg (ref 26.0–34.0)
MCHC: 33.3 g/dL (ref 30.0–36.0)
MCV: 97.7 fL (ref 80.0–100.0)
Monocytes Absolute: 0.6 10*3/uL (ref 0.1–1.0)
Monocytes Relative: 9 %
Neutro Abs: 4.6 10*3/uL (ref 1.7–7.7)
Neutrophils Relative %: 69 %
Platelet Count: 204 10*3/uL (ref 150–400)
RBC: 4.76 MIL/uL (ref 4.22–5.81)
RDW: 12.9 % (ref 11.5–15.5)
WBC Count: 6.7 10*3/uL (ref 4.0–10.5)
nRBC: 0 % (ref 0.0–0.2)

## 2022-02-12 LAB — CMP (CANCER CENTER ONLY)
ALT: 24 U/L (ref 0–44)
AST: 26 U/L (ref 15–41)
Albumin: 4.3 g/dL (ref 3.5–5.0)
Alkaline Phosphatase: 59 U/L (ref 38–126)
Anion gap: 3 — ABNORMAL LOW (ref 5–15)
BUN: 18 mg/dL (ref 8–23)
CO2: 33 mmol/L — ABNORMAL HIGH (ref 22–32)
Calcium: 9.6 mg/dL (ref 8.9–10.3)
Chloride: 106 mmol/L (ref 98–111)
Creatinine: 0.93 mg/dL (ref 0.61–1.24)
GFR, Estimated: >60 mL/min (ref >60)
Glucose, Bld: 102 mg/dL — ABNORMAL HIGH (ref 70–99)
Potassium: 4.9 mmol/L (ref 3.5–5.1)
Sodium: 142 mmol/L (ref 135–145)
Total Bilirubin: 0.5 mg/dL (ref 0.3–1.2)
Total Protein: 7.4 g/dL (ref 6.5–8.1)

## 2022-02-12 NOTE — Progress Notes (Signed)
[place] [phone]   [phone]  INITIAL CONSULT NOTE  Patient Care Team: Antony Contras, MD as PCP - General (Family Medicine)  Hematological/Oncological History # Bilateral pulmonary emboli  # Recurrent VTEs 01/08/2022: CT angio chest showed segmental and subsegmental pulmonary emboli in the right lower lobe and subsegmental pulmonary emboli in the left upper lobe 02/12/2022: establish care with Dr. Lorenso Courier   CHIEF COMPLAINTS/PURPOSE OF CONSULTATION:  "Bilateral pulmonary emboli "  HISTORY OF PRESENTING ILLNESS:  Brian Rush 76 y.o. male with medical history significant for WPW on EKG, arthritis, GERD who presents for evaluation of bilateral pulmonary emboli.   On review of the previous records Mr. Henrietta Hoover underwent a CT angio chest on 01/08/2022 for monitoring of an aortic aneursym. That scan showed segmental and subsegmental pulmonary emboli in the right lower lobe and subsegmental pulmonary emboli in the left upper lobe  On exam today Mr. Henrietta Hoover has had 2 episodes of VTE's, 1 occurring 1 year ago and the most recent one last month.  His prior VTE occurred after he drove to [place].  He notes it was a prolonged car drive and he was driving up there for his son who is quite sick with CHF and has an LVAD.  He reports that he traveled up to [place] in early October 2022.  Shortly thereafter in November he began developing swelling and worsening shortness of breath.  He reports he took some aspirin and did not resolve he had further evaluation which showed his blood clots.  He was also found to have an aortic aneurysm which was the cause of his most recent CT scan which found his incidental pulmonary embolism this month.  He reports that he had had no recent travel other than a trip to [place] approximately 4 weeks ago.  He notes that he is up-to-date on his COVID booster shot and is concerned that may be related, though the shot was remote.   He reports that he is tolerating his blood thinner quite well with no bleeding and rare bruising.  On further discussion he notes that his father died of colon cancer, and his mother had a heart attack at age 74.  He reports he has 2 adopted children.  He notes that he is a never smoker but does drink about 3 glasses of wine per week.  He notes that he previously worked as an Art gallery manager.  He otherwise denies any fevers, chills, sweats, nausea, vomiting or diarrhea.  A full 10 point ROS is otherwise negative.  MEDICAL HISTORY:  Past Medical History:  Diagnosis Date   Allergy    Arthritis    GERD (gastroesophageal reflux disease)    Headache    Yves Dill Parkinson White pattern seen on electrocardiogram     SURGICAL HISTORY: Past Surgical History:  Procedure Laterality Date   JOINT REPLACEMENT Right    2011   TOTAL KNEE ARTHROPLASTY Left 06/02/2019   Procedure: TOTAL KNEE ARTHROPLASTY;  Surgeon: Earlie Server, MD;  Location: [facility];  Service: Orthopedics;  Laterality: Left;    SOCIAL HISTORY: Social History   Socioeconomic History   Marital status: Married    Spouse name: Not on file   Number of children: Not on file   Years of education: Not on file   Highest education level: Not on file  Occupational History   Not on file  Tobacco Use   Smoking status: Never   Smokeless tobacco: Never  Substance and Sexual Activity  Alcohol use: Yes    Alcohol/week: 1.0 standard drink of alcohol    Types: 1 Glasses of wine per week    Comment: 4 glasses per week   Drug use: Never   Sexual activity: Not on file  Other Topics Concern   Not on file  Social History Narrative   Not on file   Social Determinants of Health   Financial Resource Strain: Not on file  Food Insecurity: Not on file  Transportation Needs: Not on file  Physical Activity: Not on file  Stress: Not on file  Social Connections: Not on file  Intimate Partner Violence: Not on file    FAMILY HISTORY: No  family history on file.  ALLERGIES:  is allergic to latex and quinolones.  MEDICATIONS:  Current Outpatient Medications  Medication Sig Dispense Refill   loratadine (CLARITIN) 10 MG tablet Take 10 mg by mouth daily as needed for allergies.     Cholecalciferol (VITAMIN D3) 50 MCG (2000 UT) TABS Take 2,000 Units by mouth daily.     Coenzyme Q10 (COQ10) 100 MG CAPS Take 100 mg by mouth daily.     ELIQUIS 5 MG TABS tablet Take 5 mg by mouth 2 (two) times daily.     fluticasone (FLONASE) 50 MCG/ACT nasal spray Place 1-2 sprays into both nostrils daily as needed for allergies or rhinitis.     ketotifen (ZADITOR) 0.025 % ophthalmic solution Place 1 drop into both eyes 2 (two) times daily as needed (allergies.).     Multiple Vitamin (MULTIVITAMIN WITH MINERALS) TABS tablet Take 1 tablet by mouth daily.     Omega-3 1000 MG CAPS Take 1,000 mg by mouth daily.     oxymetazoline (AFRIN) 0.05 % nasal spray Place 1 spray into both nostrils 2 (two) times daily as needed for congestion.     Phenyleph-Doxylamine-DM-APAP (DAYTIME/NIGHTTIME PO) Take 1-2 tablets by mouth 3 (three) times daily as needed (cold symptoms).     phenylephrine (SUDAFED PE) 10 MG TABS tablet Take 10 mg by mouth every 4 (four) hours as needed (congestion.).     Polyethyl Glycol-Propyl Glycol (LUBRICANT EYE DROPS) 0.4-0.3 % SOLN Place 1-2 drops into both eyes 3 (three) times daily as needed (dry/irritated eyes.).     rosuvastatin (CRESTOR) 10 MG tablet Take 10 mg by mouth at bedtime.      sildenafil (REVATIO) 20 MG tablet Take 60-80 mg by mouth daily as needed (erectile dysfunction.).     No current facility-administered medications for this visit.    REVIEW OF SYSTEMS:   Constitutional: ( - ) fevers, ( - )  chills , ( - ) night sweats Eyes: ( - ) blurriness of vision, ( - ) double vision, ( - ) watery eyes Ears, nose, mouth, throat, and face: ( - ) mucositis, ( - ) sore throat Respiratory: ( - ) cough, ( - ) dyspnea, ( - )  wheezes Cardiovascular: ( - ) palpitation, ( - ) chest discomfort, ( - ) lower extremity swelling Gastrointestinal:  ( - ) nausea, ( - ) heartburn, ( - ) change in bowel habits Skin: ( - ) abnormal skin rashes Lymphatics: ( - ) new lymphadenopathy, ( - ) easy bruising Neurological: ( - ) numbness, ( - ) tingling, ( - ) new weaknesses Behavioral/Psych: ( - ) mood change, ( - ) new changes  All other systems were reviewed with the patient and are negative.  PHYSICAL EXAMINATION:  Vitals:   02/12/22 1303  BP: 139/77  Pulse:  69  Resp: 17  SpO2: 99%   Filed Weights   02/12/22 1303  Weight: 213 lb 1 oz (96.6 kg)    GENERAL: well appearing elderly Caucasian male in NAD  SKIN: skin color, texture, turgor are normal, no rashes or significant lesions EYES: conjunctiva are pink and non-injected, sclera clear LUNGS: clear to auscultation and percussion with normal breathing effort HEART: regular rate & rhythm and no murmurs and no lower extremity edema Musculoskeletal: no cyanosis of digits and no clubbing  PSYCH: alert & oriented x 3, fluent speech NEURO: no focal motor/sensory deficits  LABORATORY DATA:  I have reviewed the data as listed    Latest Ref Rng & Units 02/12/2022    2:29 PM 05/22/2019   10:59 AM 08/23/2009    4:45 AM  CBC  WBC 4.0 - 10.5 K/uL 6.7  6.9  11.7   Hemoglobin 13.0 - 17.0 g/dL 15.5  14.5  9.5   Hematocrit 39.0 - 52.0 % 46.5  44.2  28.2   Platelets 150 - 400 K/uL 204  205  136        Latest Ref Rng & Units 02/12/2022    2:29 PM 05/22/2019   10:59 AM 08/23/2009    4:45 AM  CMP  Glucose 70 - 99 mg/dL 102  104  130   BUN 8 - 23 mg/dL '18  19  7   '$ Creatinine 0.61 - 1.24 mg/dL 0.93  0.81  0.88   Sodium 135 - 145 mmol/L 142  142  137   Potassium 3.5 - 5.1 mmol/L 4.9  4.7  4.2   Chloride 98 - 111 mmol/L 106  108  101   CO2 22 - 32 mmol/L 33  28  30   Calcium 8.9 - 10.3 mg/dL 9.6  9.2  8.2   Total Protein 6.5 - 8.1 g/dL 7.4  6.5    Total Bilirubin 0.3 - 1.2  mg/dL 0.5  0.8    Alkaline Phos 38 - 126 U/L 59  45    AST 15 - 41 U/L 26  28    ALT 0 - 44 U/L 24  26       ASSESSMENT & PLAN Becky Augusta Rush 76 y.o. male with medical history significant for WPW on EKG, arthritis, GERD who presents for evaluation of bilateral pulmonary emboli.   After review of the labs, review of the records, and discussion with the patient the patients findings are most consistent with recurrent VTE's, with the last being unprovoked and incidentally found while evaluating his aortic aneurysm.  A provoked venous thromboembolism (VTE) is one that has a clear inciting factor or event. Provoking factors include prolonged travel/immobility, surgery (particular abdominal or orthropedic), trauma,  and pregnancy/ estrogen containing birth control. After a detailed history and review of the records there is no clear provoking factor for this patient's VTE.  Patients with unprovoked VTEs have up to 25% recurrence after 5 years and 36% at 10 years, with 4% of these clots being fatal (BMJ?2019;366:l4363). Therefore the formal recommendation for unprovoked VTE's is lifelong anticoagulation, as the cause may not be transient or reversible. We recommend 6 months or full strength anticoagulation with a re-evaluation after that time.  The patient's will then have a choice of maintenance dose DOAC (preferred, recommended), '81mg'$  ASA PO daily (non-preferred), or no further anticoagulation (not recommended).    #Unprovoked Pulmonary Embolism  # Recurrent VTEs  --findings at this time are consistent with a unprovoked VTE  --  will order baseline CMP and CBC to assure labs are adequate for DOAC therapy  --rule out APS with anticardiolipin and anti beta2 glycoprotein antibodies.  Lupus anticoagulant panel would be altered by presence of blood thinner, will hold on this testing.   --recommend the patient continue eliquis '5mg'$  BID  --patient denies any bleeding, bruising, or dark stools on this  medication. It is well tolerated. No difficulties accessing/affording the medication  --RTC in 5 months' time with strict return precautions for overt signs of bleeding.   Orders Placed This Encounter  Procedures   CBC with Differential ([place] Only)    Standing Status:   Future    Number of Occurrences:   1    Standing Expiration Date:   02/13/2023   CMP ([place] only)    Standing Status:   Future    Number of Occurrences:   1    Standing Expiration Date:   02/13/2023   Beta-2-glycoprotein i abs, IgG/M/A    Standing Status:   Future    Number of Occurrences:   1    Standing Expiration Date:   02/12/2023   Cardiolipin antibodies, IgG, IgM, IgA*    Standing Status:   Future    Number of Occurrences:   1    Standing Expiration Date:   02/12/2023   Factor 5 Leiden*    Standing Status:   Future    Number of Occurrences:   1    Standing Expiration Date:   02/12/2023   Prothrombin gene mutation*    Standing Status:   Future    Number of Occurrences:   1    Standing Expiration Date:   02/12/2023    All questions were answered. The patient knows to call the clinic with any problems, questions or concerns.  A total of more than 60 minutes were spent on this encounter with face-to-face time and non-face-to-face time, including preparing to see the patient, ordering tests and/or medications, counseling the patient and coordination of care as outlined above.   Ledell Peoples, MD Department of Hematology/Oncology [facility] Phone: [phone] Pager: [phone] Email: [email or web address]  02/14/2022 4:06 PM

## 2022-02-15 LAB — CARDIOLIPIN ANTIBODIES, IGG, IGM, IGA
Anticardiolipin IgA: <9 APL U/mL (ref 0–11)
Anticardiolipin IgG: <9 GPL U/mL (ref 0–14)
Anticardiolipin IgM: <9 MPL U/mL (ref 0–12)

## 2022-02-15 LAB — BETA-2-GLYCOPROTEIN I ABS, IGG/M/A
Beta-2 Glyco I IgG: <9 GPI IgG units (ref 0–20)
Beta-2-Glycoprotein I IgA: <9 GPI IgA units (ref 0–25)
Beta-2-Glycoprotein I IgM: <9 GPI IgM units (ref 0–32)

## 2022-02-17 ENCOUNTER — Telehealth: Payer: Self-pay | Admitting: *Deleted

## 2022-02-17 ENCOUNTER — Telehealth: Payer: Self-pay | Admitting: Hematology and Oncology

## 2022-02-17 NOTE — Telephone Encounter (Signed)
Called patient to notify of new upcoming appointment. Patient notified.

## 2022-02-17 NOTE — Telephone Encounter (Signed)
Patient spoke with scheduler and communicated question.   "This patient had a question about a new allergy found in his recent labs. If you have time today can you reach out to his cell [phone] and let him know what exactly the allergy is? "  Routing to Dr Lorenso Courier to please review labs and advise how to respond.

## 2022-02-19 LAB — PROTHROMBIN GENE MUTATION

## 2022-02-19 LAB — FACTOR 5 LEIDEN

## 2022-02-19 NOTE — Telephone Encounter (Signed)
TCT patient regarding his question about an allergy: Spoke with pt. Per Dr. Santiago Glad: "Not sure what he is referring to regarding an allergy. Please call to clarify.  Some of his labs are still pending however we have at this time shows no concerning abnormalities. "  Pt states the allergy question was related to another MD visit he had recently and mentioned it to [place] by mistake.  Advised we will call him back after his Factor 5 Leiden results comes back. Pt voiced understanding.

## 2022-07-15 ENCOUNTER — Inpatient Hospital Stay: Payer: Medicare Other | Admitting: Physician Assistant

## 2022-07-15 ENCOUNTER — Ambulatory Visit: Payer: Medicare Other | Admitting: Hematology and Oncology

## 2022-07-15 ENCOUNTER — Inpatient Hospital Stay: Payer: Medicare Other | Attending: Physician Assistant

## 2022-07-15 ENCOUNTER — Other Ambulatory Visit: Payer: Self-pay | Admitting: Physician Assistant

## 2022-07-15 VITALS — BP 121/68 | HR 63 | Temp 97.9°F | Resp 17 | Ht 77.0 in | Wt 209.0 lb

## 2022-07-15 DIAGNOSIS — Z86711 Personal history of pulmonary embolism: Secondary | ICD-10-CM | POA: Insufficient documentation

## 2022-07-15 DIAGNOSIS — Z79899 Other long term (current) drug therapy: Secondary | ICD-10-CM | POA: Insufficient documentation

## 2022-07-15 DIAGNOSIS — Z7901 Long term (current) use of anticoagulants: Secondary | ICD-10-CM | POA: Insufficient documentation

## 2022-07-15 DIAGNOSIS — I2694 Multiple subsegmental pulmonary emboli without acute cor pulmonale: Secondary | ICD-10-CM

## 2022-07-15 DIAGNOSIS — Z86718 Personal history of other venous thrombosis and embolism: Secondary | ICD-10-CM | POA: Diagnosis not present

## 2022-07-15 DIAGNOSIS — K219 Gastro-esophageal reflux disease without esophagitis: Secondary | ICD-10-CM | POA: Insufficient documentation

## 2022-07-15 LAB — CMP (CANCER CENTER ONLY)
ALT: 20 U/L (ref 0–44)
AST: 24 U/L (ref 15–41)
Albumin: 4.1 g/dL (ref 3.5–5.0)
Alkaline Phosphatase: 52 U/L (ref 38–126)
Anion gap: 3 — ABNORMAL LOW (ref 5–15)
BUN: 16 mg/dL (ref 8–23)
CO2: 34 mmol/L — ABNORMAL HIGH (ref 22–32)
Calcium: 9.1 mg/dL (ref 8.9–10.3)
Chloride: 106 mmol/L (ref 98–111)
Creatinine: 0.86 mg/dL (ref 0.61–1.24)
GFR, Estimated: >60 mL/min (ref >60)
Glucose, Bld: 94 mg/dL (ref 70–99)
Potassium: 4.5 mmol/L (ref 3.5–5.1)
Sodium: 143 mmol/L (ref 135–145)
Total Bilirubin: 0.7 mg/dL (ref 0.3–1.2)
Total Protein: 6.6 g/dL (ref 6.5–8.1)

## 2022-07-15 LAB — CBC WITH DIFFERENTIAL (CANCER CENTER ONLY)
Abs Immature Granulocytes: 0.01 10*3/uL (ref 0.00–0.07)
Basophils Absolute: 0 10*3/uL (ref 0.0–0.1)
Basophils Relative: 1 %
Eosinophils Absolute: 0.1 10*3/uL (ref 0.0–0.5)
Eosinophils Relative: 2 %
HCT: 44.6 % (ref 39.0–52.0)
Hemoglobin: 14.8 g/dL (ref 13.0–17.0)
Immature Granulocytes: 0 %
Lymphocytes Relative: 27 %
Lymphs Abs: 1.5 10*3/uL (ref 0.7–4.0)
MCH: 32.6 pg (ref 26.0–34.0)
MCHC: 33.2 g/dL (ref 30.0–36.0)
MCV: 98.2 fL (ref 80.0–100.0)
Monocytes Absolute: 0.5 10*3/uL (ref 0.1–1.0)
Monocytes Relative: 9 %
Neutro Abs: 3.6 10*3/uL (ref 1.7–7.7)
Neutrophils Relative %: 61 %
Platelet Count: 185 10*3/uL (ref 150–400)
RBC: 4.54 MIL/uL (ref 4.22–5.81)
RDW: 12.8 % (ref 11.5–15.5)
WBC Count: 5.8 10*3/uL (ref 4.0–10.5)
nRBC: 0 % (ref 0.0–0.2)

## 2022-07-15 MED ORDER — RIVAROXABAN 10 MG PO TABS: 10.0000 mg | ORAL_TABLET | Freq: Every day | ORAL | 6 refills | Status: DC

## 2022-07-15 NOTE — Progress Notes (Signed)
[facility] [phone]   Fax:(336) [phone]  PROGRESS NOTE  Patient Care Team: Tally Joe, MD as PCP - General (Family Medicine)  Hematological/Oncological History # Bilateral pulmonary emboli  # Recurrent VTEs 12/2020: Diagnosed with DVT and PE after driving to [place] in October 2022. Received anticoagulation for 3 months.  01/08/2022: CT angio chest showed segmental and subsegmental pulmonary emboli in the right lower lobe and subsegmental pulmonary emboli in the left upper lobe 02/12/2022: establish care with Dr. Leonides Schanz   CHIEF COMPLAINTS/PURPOSE OF CONSULTATION:  "Bilateral pulmonary emboli "  HISTORY OF PRESENTING ILLNESS:  Brian Rush 77 y.o. male returns for a follow up for history of PE/DVT. He is unaccompanied for this visit. He was last seen on 02/12/2022 to establish care with Dr. Leonides Schanz.   Brian Rush reports that he is tolerating Eliquis therapy without any signs of bleeding. He does struggle with remembering to take his medication twice a day. He continues to stay active and complete his ADLs on his own. He otherwise denies any fevers, chills, sweats, nausea, vomiting or diarrhea.  A full 10 point ROS is otherwise negative.  MEDICAL HISTORY:  Past Medical History:  Diagnosis Date   Allergy    Arthritis    GERD (gastroesophageal reflux disease)    Headache    Evelene Croon Parkinson White pattern seen on electrocardiogram     SURGICAL HISTORY: Past Surgical History:  Procedure Laterality Date   JOINT REPLACEMENT Right    2011   TOTAL KNEE ARTHROPLASTY Left 06/02/2019   Procedure: TOTAL KNEE ARTHROPLASTY;  Surgeon: Frederico Hamman, MD;  Location: [facility];  Service: Orthopedics;  Laterality: Left;    SOCIAL HISTORY: Social History   Socioeconomic History   Marital status: Married    Spouse name: Not on file   Number of children: Not on file   Years of education: Not on file   Highest education level: Not on file   Occupational History   Not on file  Tobacco Use   Smoking status: Never   Smokeless tobacco: Never  Substance and Sexual Activity   Alcohol use: Yes    Alcohol/week: 1.0 standard drink of alcohol    Types: 1 Glasses of wine per week    Comment: 4 glasses per week   Drug use: Never   Sexual activity: Not on file  Other Topics Concern   Not on file  Social History Narrative   Not on file   Social Determinants of Health   Financial Resource Strain: Not on file  Food Insecurity: Not on file  Transportation Needs: Not on file  Physical Activity: Not on file  Stress: Not on file  Social Connections: Not on file  Intimate Partner Violence: Not on file    FAMILY HISTORY: No family history on file.  ALLERGIES:  is allergic to latex and quinolones.  MEDICATIONS:  Current Outpatient Medications  Medication Sig Dispense Refill   Cholecalciferol (VITAMIN D3) 50 MCG (2000 UT) TABS Take 2,000 Units by mouth daily.     Coenzyme Q10 (COQ10) 100 MG CAPS Take 100 mg by mouth daily.     ELIQUIS 5 MG TABS tablet Take 5 mg by mouth 2 (two) times daily.     fluticasone (FLONASE) 50 MCG/ACT nasal spray Place 1-2 sprays into both nostrils daily as needed for allergies or rhinitis.     ketotifen (ZADITOR) 0.025 % ophthalmic solution Place 1 drop into both eyes 2 (two) times daily as needed (allergies.).  loratadine (CLARITIN) 10 MG tablet Take 10 mg by mouth daily as needed for allergies.     Multiple Vitamin (MULTIVITAMIN WITH MINERALS) TABS tablet Take 1 tablet by mouth daily.     Omega-3 1000 MG CAPS Take 1,000 mg by mouth daily.     oxymetazoline (AFRIN) 0.05 % nasal spray Place 1 spray into both nostrils 2 (two) times daily as needed for congestion.     phenylephrine (SUDAFED PE) 10 MG TABS tablet Take 10 mg by mouth every 4 (four) hours as needed (congestion.).     Polyethyl Glycol-Propyl Glycol (LUBRICANT EYE DROPS) 0.4-0.3 % SOLN Place 1-2 drops into both eyes 3 (three) times daily  as needed (dry/irritated eyes.).     rivaroxaban (XARELTO) 10 MG TABS tablet Take 1 tablet (10 mg total) by mouth daily. 30 tablet 6   rosuvastatin (CRESTOR) 10 MG tablet Take 10 mg by mouth at bedtime.      sildenafil (REVATIO) 20 MG tablet Take 60-80 mg by mouth daily as needed (erectile dysfunction.).     Phenyleph-Doxylamine-DM-APAP (DAYTIME/NIGHTTIME PO) Take 1-2 tablets by mouth 3 (three) times daily as needed (cold symptoms).     No current facility-administered medications for this visit.    REVIEW OF SYSTEMS:   Constitutional: ( - ) fevers, ( - )  chills , ( - ) night sweats Eyes: ( - ) blurriness of vision, ( - ) double vision, ( - ) watery eyes Ears, nose, mouth, throat, and face: ( - ) mucositis, ( - ) sore throat Respiratory: ( - ) cough, ( - ) dyspnea, ( - ) wheezes Cardiovascular: ( - ) palpitation, ( - ) chest discomfort, ( - ) lower extremity swelling Gastrointestinal:  ( - ) nausea, ( - ) heartburn, ( - ) change in bowel habits Skin: ( - ) abnormal skin rashes Lymphatics: ( - ) new lymphadenopathy, ( - ) easy bruising Neurological: ( - ) numbness, ( - ) tingling, ( - ) new weaknesses Behavioral/Psych: ( - ) mood change, ( - ) new changes  All other systems were reviewed with the patient and are negative.  PHYSICAL EXAMINATION:  Vitals:   07/15/22 1511  BP: 121/68  Pulse: 63  Resp: 17  Temp: 97.9 F (36.6 C)  SpO2: 98%   Filed Weights   07/15/22 1511  Weight: 209 lb (94.8 kg)    GENERAL: well appearing elderly Caucasian male in NAD  SKIN: skin color, texture, turgor are normal, no rashes or significant lesions EYES: conjunctiva are pink and non-injected, sclera clear LUNGS: clear to auscultation and percussion with normal breathing effort HEART: regular rate & rhythm and no murmurs and no lower extremity edema Musculoskeletal: no cyanosis of digits and no clubbing  PSYCH: alert & oriented x 3, fluent speech NEURO: no focal motor/sensory  deficits  LABORATORY DATA:  I have reviewed the data as listed    Latest Ref Rng & Units 07/15/2022    2:51 PM 02/12/2022    2:29 PM 05/22/2019   10:59 AM  CBC  WBC 4.0 - 10.5 K/uL 5.8  6.7  6.9   Hemoglobin 13.0 - 17.0 g/dL 16.1  09.6  04.5   Hematocrit 39.0 - 52.0 % 44.6  46.5  44.2   Platelets 150 - 400 K/uL 185  204  205        Latest Ref Rng & Units 07/15/2022    2:51 PM 02/12/2022    2:29 PM 05/22/2019   10:59 AM  CMP  Glucose 70 - 99 mg/dL 94  098  119   BUN 8 - 23 mg/dL 16  18  19    Creatinine 0.61 - 1.24 mg/dL 1.47  8.29  5.62   Sodium 135 - 145 mmol/L 143  142  142   Potassium 3.5 - 5.1 mmol/L 4.5  4.9  4.7   Chloride 98 - 111 mmol/L 106  106  108   CO2 22 - 32 mmol/L 34  33  28   Calcium 8.9 - 10.3 mg/dL 9.1  9.6  9.2   Total Protein 6.5 - 8.1 g/dL 6.6  7.4  6.5   Total Bilirubin 0.3 - 1.2 mg/dL 0.7  0.5  0.8   Alkaline Phos 38 - 126 U/L 52  59  45   AST 15 - 41 U/L 24  26  28    ALT 0 - 44 U/L 20  24  26       ASSESSMENT & PLAN Brian Rush is a 77 y.o. male returns for a follow up for history of recurrent VTE.  #Unprovoked Pulmonary Embolism  # Recurrent VTEs  --findings at this time are consistent with a unprovoked VTE  --labs today were reviewed and require no intervention. --patient has completed 6 months of full strength Eliquis therapy and he is eligible for maintenance dose which is 2.5 mg BID. Patient is hesitant to continue on Eliquis therapy due to twice daily dosing. I advised that recommendation is indefinite anticoagulation so an alternative is Xarelto 10 mg daily. Patient will check with his insurance which medicine is cheaper but prefers Xarelto since it is once a day. He will follow up by end of the week on which medicine he will continue on.  --RTC in 6 months' time with strict return precautions for overt signs of bleeding.   No orders of the defined types were placed in this encounter.   All questions were answered. The patient knows  to call the clinic with any problems, questions or concerns.  A total of more than 30 minutes were spent on this encounter with face-to-face time and non-face-to-face time, including preparing to see the patient, ordering tests and/or medications, counseling the patient and coordination of care as outlined above.   Georga Kaufmann PA-C Dept of Hematology and Oncology [facility] at [facility] Phone: [phone]   07/15/2022 4:44 PM

## 2022-07-16 ENCOUNTER — Telehealth: Payer: Self-pay | Admitting: Physician Assistant

## 2022-07-17 ENCOUNTER — Telehealth: Payer: Self-pay | Admitting: Hematology and Oncology

## 2022-10-15 ENCOUNTER — Inpatient Hospital Stay: Payer: Medicare Other | Attending: Hematology and Oncology

## 2022-10-15 DIAGNOSIS — Z79899 Other long term (current) drug therapy: Secondary | ICD-10-CM | POA: Diagnosis not present

## 2022-10-15 DIAGNOSIS — K219 Gastro-esophageal reflux disease without esophagitis: Secondary | ICD-10-CM | POA: Diagnosis not present

## 2022-10-15 DIAGNOSIS — M1990 Unspecified osteoarthritis, unspecified site: Secondary | ICD-10-CM | POA: Diagnosis not present

## 2022-10-15 DIAGNOSIS — Z7901 Long term (current) use of anticoagulants: Secondary | ICD-10-CM | POA: Diagnosis not present

## 2022-10-15 DIAGNOSIS — I2694 Multiple subsegmental pulmonary emboli without acute cor pulmonale: Secondary | ICD-10-CM | POA: Insufficient documentation

## 2022-10-15 LAB — CBC WITH DIFFERENTIAL (CANCER CENTER ONLY)
Abs Immature Granulocytes: 0.02 10*3/uL (ref 0.00–0.07)
Basophils Absolute: 0 10*3/uL (ref 0.0–0.1)
Basophils Relative: 1 %
Eosinophils Absolute: 0.1 10*3/uL (ref 0.0–0.5)
Eosinophils Relative: 2 %
HCT: 43.4 % (ref 39.0–52.0)
Hemoglobin: 14.6 g/dL (ref 13.0–17.0)
Immature Granulocytes: 0 %
Lymphocytes Relative: 24 %
Lymphs Abs: 1.5 10*3/uL (ref 0.7–4.0)
MCH: 33 pg (ref 26.0–34.0)
MCHC: 33.6 g/dL (ref 30.0–36.0)
MCV: 98 fL (ref 80.0–100.0)
Monocytes Absolute: 0.7 10*3/uL (ref 0.1–1.0)
Monocytes Relative: 11 %
Neutro Abs: 3.9 10*3/uL (ref 1.7–7.7)
Neutrophils Relative %: 62 %
Platelet Count: 200 10*3/uL (ref 150–400)
RBC: 4.43 MIL/uL (ref 4.22–5.81)
RDW: 12.7 % (ref 11.5–15.5)
WBC Count: 6.2 10*3/uL (ref 4.0–10.5)
nRBC: 0 % (ref 0.0–0.2)

## 2022-10-15 LAB — CMP (CANCER CENTER ONLY)
ALT: 19 U/L (ref 0–44)
AST: 24 U/L (ref 15–41)
Albumin: 4.1 g/dL (ref 3.5–5.0)
Alkaline Phosphatase: 58 U/L (ref 38–126)
Anion gap: 5 (ref 5–15)
BUN: 15 mg/dL (ref 8–23)
CO2: 31 mmol/L (ref 22–32)
Calcium: 9.2 mg/dL (ref 8.9–10.3)
Chloride: 105 mmol/L (ref 98–111)
Creatinine: 0.88 mg/dL (ref 0.61–1.24)
GFR, Estimated: >60 mL/min (ref >60)
Glucose, Bld: 96 mg/dL (ref 70–99)
Potassium: 4.2 mmol/L (ref 3.5–5.1)
Sodium: 141 mmol/L (ref 135–145)
Total Bilirubin: 0.7 mg/dL (ref 0.3–1.2)
Total Protein: 6.9 g/dL (ref 6.5–8.1)

## 2022-12-03 ENCOUNTER — Other Ambulatory Visit: Payer: Self-pay | Admitting: Thoracic Surgery (Cardiothoracic Vascular Surgery)

## 2022-12-03 DIAGNOSIS — I7121 Aneurysm of the ascending aorta, without rupture: Secondary | ICD-10-CM

## 2023-01-14 ENCOUNTER — Inpatient Hospital Stay: Payer: Medicare Other | Admitting: Hematology and Oncology

## 2023-01-14 ENCOUNTER — Inpatient Hospital Stay: Payer: Medicare Other | Attending: Hematology and Oncology

## 2023-01-14 VITALS — BP 146/76 | HR 63 | Temp 98.7°F | Resp 17 | Wt 219.7 lb

## 2023-01-14 DIAGNOSIS — I2699 Other pulmonary embolism without acute cor pulmonale: Secondary | ICD-10-CM | POA: Diagnosis present

## 2023-01-14 DIAGNOSIS — Z7901 Long term (current) use of anticoagulants: Secondary | ICD-10-CM | POA: Diagnosis not present

## 2023-01-14 DIAGNOSIS — Z86718 Personal history of other venous thrombosis and embolism: Secondary | ICD-10-CM | POA: Insufficient documentation

## 2023-01-14 DIAGNOSIS — I2694 Multiple subsegmental pulmonary emboli without acute cor pulmonale: Secondary | ICD-10-CM

## 2023-01-14 DIAGNOSIS — Z86711 Personal history of pulmonary embolism: Secondary | ICD-10-CM | POA: Diagnosis not present

## 2023-01-14 DIAGNOSIS — Z79899 Other long term (current) drug therapy: Secondary | ICD-10-CM | POA: Diagnosis not present

## 2023-01-14 DIAGNOSIS — M25471 Effusion, right ankle: Secondary | ICD-10-CM | POA: Insufficient documentation

## 2023-01-14 LAB — CBC WITH DIFFERENTIAL (CANCER CENTER ONLY)
Abs Immature Granulocytes: 0.02 10*3/uL (ref 0.00–0.07)
Basophils Absolute: 0.1 10*3/uL (ref 0.0–0.1)
Basophils Relative: 1 %
Eosinophils Absolute: 0.1 10*3/uL (ref 0.0–0.5)
Eosinophils Relative: 1 %
HCT: 46.2 % (ref 39.0–52.0)
Hemoglobin: 15.5 g/dL (ref 13.0–17.0)
Immature Granulocytes: 0 %
Lymphocytes Relative: 23 %
Lymphs Abs: 1.7 10*3/uL (ref 0.7–4.0)
MCH: 33 pg (ref 26.0–34.0)
MCHC: 33.5 g/dL (ref 30.0–36.0)
MCV: 98.3 fL (ref 80.0–100.0)
Monocytes Absolute: 0.6 10*3/uL (ref 0.1–1.0)
Monocytes Relative: 9 %
Neutro Abs: 4.8 10*3/uL (ref 1.7–7.7)
Neutrophils Relative %: 66 %
Platelet Count: 194 10*3/uL (ref 150–400)
RBC: 4.7 MIL/uL (ref 4.22–5.81)
RDW: 12.6 % (ref 11.5–15.5)
WBC Count: 7.3 10*3/uL (ref 4.0–10.5)
nRBC: 0 % (ref 0.0–0.2)

## 2023-01-14 LAB — CMP (CANCER CENTER ONLY)
ALT: 21 U/L (ref 0–44)
AST: 27 U/L (ref 15–41)
Albumin: 4.4 g/dL (ref 3.5–5.0)
Alkaline Phosphatase: 58 U/L (ref 38–126)
Anion gap: 5 (ref 5–15)
BUN: 16 mg/dL (ref 8–23)
CO2: 32 mmol/L (ref 22–32)
Calcium: 9.7 mg/dL (ref 8.9–10.3)
Chloride: 103 mmol/L (ref 98–111)
Creatinine: 0.91 mg/dL (ref 0.61–1.24)
GFR, Estimated: >60 mL/min (ref >60)
Glucose, Bld: 100 mg/dL — ABNORMAL HIGH (ref 70–99)
Potassium: 4.4 mmol/L (ref 3.5–5.1)
Sodium: 140 mmol/L (ref 135–145)
Total Bilirubin: 0.9 mg/dL (ref <1.2)
Total Protein: 7.3 g/dL (ref 6.5–8.1)

## 2023-01-14 NOTE — Progress Notes (Signed)
[facility] [phone]   Fax:([phone]  PROGRESS NOTE  Patient Care Team: Tally Joe, MD as PCP - General (Family Medicine)  Hematological/Oncological History # Bilateral pulmonary emboli  # Recurrent VTEs 12/2020: Diagnosed with DVT and PE after driving to [place] in October 2022. Received anticoagulation for 3 months.  01/08/2022: CT angio chest showed segmental and subsegmental pulmonary emboli in the right lower lobe and subsegmental pulmonary emboli in the left upper lobe 02/12/2022: establish care with Dr. Leonides Schanz   CHIEF COMPLAINTS/PURPOSE OF CONSULTATION:  "Bilateral pulmonary emboli "  HISTORY OF PRESENTING ILLNESS:  Brian Rush 77 y.o. male returns for a follow up for history of PE/DVT. He is unaccompanied for this visit. He was last seen on 07/15/2022. In the interim since our last visit he has had no major changes in his health.  Mr .Brian Rush reports he has had no emergency room visits, hospitalizations, or new medications.  He reports that he has been doing well on Xarelto therapy with no noticeable impact.  He notes no bleeding, bruising, or dark stools.  He denies any blood in the urine or stool.  He reports the medication used to cost about $10 per month but then he "hit the limit" and now pays about $56 per month.  He thinks it may be next year it will drop back down to $10 per month.  He reports that he is not having any leg pain though he does have some occasional swelling in his right ankle.  He notes that he does not have any pain or erythema.  Overall he is tolerating anticoagulation therapy well and is willing and able to continue at this time.  He otherwise denies any fevers, chills, sweats, nausea, vomiting or diarrhea.  A full 10 point ROS is otherwise negative.  MEDICAL HISTORY:  Past Medical History:  Diagnosis Date   Allergy    Arthritis    GERD (gastroesophageal reflux disease)    Headache    Evelene Croon Parkinson  White pattern seen on electrocardiogram     SURGICAL HISTORY: Past Surgical History:  Procedure Laterality Date   JOINT REPLACEMENT Right    2011   TOTAL KNEE ARTHROPLASTY Left 06/02/2019   Procedure: TOTAL KNEE ARTHROPLASTY;  Surgeon: Frederico Hamman, MD;  Location: [facility];  Service: Orthopedics;  Laterality: Left;    SOCIAL HISTORY: Social History   Socioeconomic History   Marital status: Married    Spouse name: Not on file   Number of children: Not on file   Years of education: Not on file   Highest education level: Not on file  Occupational History   Not on file  Tobacco Use   Smoking status: Never   Smokeless tobacco: Never  Substance and Sexual Activity   Alcohol use: Yes    Alcohol/week: 1.0 standard drink of alcohol    Types: 1 Glasses of wine per week    Comment: 4 glasses per week   Drug use: Never   Sexual activity: Not on file  Other Topics Concern   Not on file  Social History Narrative   Not on file   Social Determinants of Health   Financial Resource Strain: Not on file  Food Insecurity: Not on file  Transportation Needs: Not on file  Physical Activity: Not on file  Stress: Not on file  Social Connections: Not on file  Intimate Partner Violence: Not on file    FAMILY HISTORY: No family history on file.  ALLERGIES:  is allergic to latex and quinolones.  MEDICATIONS:  Current Outpatient Medications  Medication Sig Dispense Refill   Cholecalciferol (VITAMIN D3) 50 MCG (2000 UT) TABS Take 2,000 Units by mouth daily.     Coenzyme Q10 (COQ10) 100 MG CAPS Take 100 mg by mouth daily.     fluticasone (FLONASE) 50 MCG/ACT nasal spray Place 1-2 sprays into both nostrils daily as needed for allergies or rhinitis.     ketotifen (ZADITOR) 0.025 % ophthalmic solution Place 1 drop into both eyes 2 (two) times daily as needed (allergies.).     loratadine (CLARITIN) 10 MG tablet Take 10 mg by mouth daily as needed for allergies.     Multiple Vitamin  (MULTIVITAMIN WITH MINERALS) TABS tablet Take 1 tablet by mouth daily.     Omega-3 1000 MG CAPS Take 1,000 mg by mouth daily.     oxymetazoline (AFRIN) 0.05 % nasal spray Place 1 spray into both nostrils 2 (two) times daily as needed for congestion.     phenylephrine (SUDAFED PE) 10 MG TABS tablet Take 10 mg by mouth every 4 (four) hours as needed (congestion.).     Polyethyl Glycol-Propyl Glycol (LUBRICANT EYE DROPS) 0.4-0.3 % SOLN Place 1-2 drops into both eyes 3 (three) times daily as needed (dry/irritated eyes.).     rivaroxaban (XARELTO) 10 MG TABS tablet Take 1 tablet (10 mg total) by mouth daily. 30 tablet 6   rosuvastatin (CRESTOR) 10 MG tablet Take 10 mg by mouth at bedtime.      sildenafil (REVATIO) 20 MG tablet Take 60-80 mg by mouth daily as needed (erectile dysfunction.).     No current facility-administered medications for this visit.    REVIEW OF SYSTEMS:   Constitutional: ( - ) fevers, ( - )  chills , ( - ) night sweats Eyes: ( - ) blurriness of vision, ( - ) double vision, ( - ) watery eyes Ears, nose, mouth, throat, and face: ( - ) mucositis, ( - ) sore throat Respiratory: ( - ) cough, ( - ) dyspnea, ( - ) wheezes Cardiovascular: ( - ) palpitation, ( - ) chest discomfort, ( - ) lower extremity swelling Gastrointestinal:  ( - ) nausea, ( - ) heartburn, ( - ) change in bowel habits Skin: ( - ) abnormal skin rashes Lymphatics: ( - ) new lymphadenopathy, ( - ) easy bruising Neurological: ( - ) numbness, ( - ) tingling, ( - ) new weaknesses Behavioral/Psych: ( - ) mood change, ( - ) new changes  All other systems were reviewed with the patient and are negative.  PHYSICAL EXAMINATION:  Vitals:   01/14/23 1514 01/14/23 1516  BP: (!) 148/86 (!) 146/76  Pulse: 63   Resp: 17   Temp: 98.7 F (37.1 C)   SpO2: 96%     Filed Weights   01/14/23 1514  Weight: 219 lb 11.2 oz (99.7 kg)     GENERAL: well appearing elderly Caucasian male in NAD  SKIN: skin color, texture,  turgor are normal, no rashes or significant lesions EYES: conjunctiva are pink and non-injected, sclera clear LUNGS: clear to auscultation and percussion with normal breathing effort HEART: regular rate & rhythm and no murmurs and no lower extremity edema Musculoskeletal: no cyanosis of digits and no clubbing  PSYCH: alert & oriented x 3, fluent speech NEURO: no focal motor/sensory deficits  LABORATORY DATA:  I have reviewed the data as listed    Latest Ref Rng & Units 01/14/2023    2:53  PM 10/15/2022    3:36 PM 07/15/2022    2:51 PM  CBC  WBC 4.0 - 10.5 K/uL 7.3  6.2  5.8   Hemoglobin 13.0 - 17.0 g/dL 32.4  40.1  02.7   Hematocrit 39.0 - 52.0 % 46.2  43.4  44.6   Platelets 150 - 400 K/uL 194  200  185        Latest Ref Rng & Units 01/14/2023    2:53 PM 10/15/2022    3:36 PM 07/15/2022    2:51 PM  CMP  Glucose 70 - 99 mg/dL 253  96  94   BUN 8 - 23 mg/dL 16  15  16    Creatinine 0.61 - 1.24 mg/dL 6.64  4.03  4.74   Sodium 135 - 145 mmol/L 140  141  143   Potassium 3.5 - 5.1 mmol/L 4.4  4.2  4.5   Chloride 98 - 111 mmol/L 103  105  106   CO2 22 - 32 mmol/L 32  31  34   Calcium 8.9 - 10.3 mg/dL 9.7  9.2  9.1   Total Protein 6.5 - 8.1 g/dL 7.3  6.9  6.6   Total Bilirubin <1.2 mg/dL 0.9  0.7  0.7   Alkaline Phos 38 - 126 U/L 58  58  52   AST 15 - 41 U/L 27  24  24    ALT 0 - 44 U/L 21  19  20       ASSESSMENT & PLAN Brian Rush is a 77 y.o. male returns for a follow up for history of recurrent VTE.  #Unprovoked Pulmonary Embolism  # Recurrent VTEs  --findings at this time are consistent with a unprovoked VTE  --labs today were reviewed and require no intervention. --patient has completed 6 months of full strength Eliquis therapy  -- continue Xarelto 10 mg daily maintenance therapy (switched to Xarelto for once daily dosing).  --labs today show white blood cell 7.3, hemoglobin 15.5, MCV 98.3, platelets 194. Cr 0.91, LFTs WNL.  --RTC in 6 months' time with strict  return precautions for overt signs of bleeding.   No orders of the defined types were placed in this encounter.   All questions were answered. The patient knows to call the clinic with any problems, questions or concerns.  A total of more than 25 minutes were spent on this encounter with face-to-face time and non-face-to-face time, including preparing to see the patient, ordering tests and/or medications, counseling the patient and coordination of care as outlined above.   Ulysees Barns, MD Department of Hematology/Oncology [facility] at [facility] Phone: [phone] Pager: [phone] Email: [email or web address]    01/14/2023 3:57 PM

## 2023-01-15 ENCOUNTER — Ambulatory Visit
Admission: RE | Admit: 2023-01-15 | Discharge: 2023-01-15 | Disposition: A | Discharge status: Home / Self Care | Payer: Medicare Other | Source: Ambulatory Visit | Attending: Thoracic Surgery (Cardiothoracic Vascular Surgery) | Admitting: Thoracic Surgery (Cardiothoracic Vascular Surgery)

## 2023-01-15 DIAGNOSIS — I7121 Aneurysm of the ascending aorta, without rupture: Secondary | ICD-10-CM

## 2023-01-15 MED ORDER — IOPAMIDOL (ISOVUE-370) INJECTION 76%
75.0000 mL | Freq: Once | INTRAVENOUS | Status: AC | PRN
  Administered 2023-01-15: 75 mL via INTRAVENOUS

## 2023-01-20 ENCOUNTER — Other Ambulatory Visit: Payer: Medicare Other

## 2023-01-26 ENCOUNTER — Ambulatory Visit: Payer: Medicare Other | Admitting: Thoracic Surgery (Cardiothoracic Vascular Surgery)

## 2023-01-26 ENCOUNTER — Encounter: Payer: Self-pay | Admitting: Thoracic Surgery (Cardiothoracic Vascular Surgery)

## 2023-01-26 VITALS — BP 147/84 | HR 68 | Resp 18 | Ht 77.0 in | Wt 212.0 lb

## 2023-01-26 DIAGNOSIS — I7121 Aneurysm of the ascending aorta, without rupture: Secondary | ICD-10-CM

## 2023-01-26 MED ORDER — METOPROLOL SUCCINATE ER 25 MG PO TB24: 25.0000 mg | ORAL_TABLET | Freq: Every day | ORAL | 5 refills | Status: DC

## 2023-01-26 NOTE — Progress Notes (Signed)
[address]       [place] [address]             [phone]     HPI: Mr. Marlena Clipper returns for follow-up of an ascending aneurysm.  Jimin Brisco is a 77 year old gentleman with a history of hyperlipidemia, DVT, PE, reflux, BPH, WPW, and an ascending aneurysm.  No prior history of hypertension.  Found to have a DVT about 2 years ago.  Was having some shortness of breath.  Had a CT which showed a 4.4 cm ascending aneurysm.  Later after a flight to [place] he was found to have bilateral pulmonary emboli.  He has been feeling well.  Does not check his blood pressure at home.  He denies any chest pain, pressure, tightness, or shortness of breath.  Planning a ski trip to [place] next week.  Past Medical History:  Diagnosis Date   Allergy    Arthritis    GERD (gastroesophageal reflux disease)    Headache    Evelene Croon Parkinson White pattern seen on electrocardiogram     Current Outpatient Medications  Medication Sig Dispense Refill   Cholecalciferol (VITAMIN D3) 50 MCG (2000 UT) TABS Take 2,000 Units by mouth daily.     Coenzyme Q10 (COQ10) 100 MG CAPS Take 100 mg by mouth daily.     fluticasone (FLONASE) 50 MCG/ACT nasal spray Place 1-2 sprays into both nostrils daily as needed for allergies or rhinitis.     ketotifen (ZADITOR) 0.025 % ophthalmic solution Place 1 drop into both eyes 2 (two) times daily as needed (allergies.).     loratadine (CLARITIN) 10 MG tablet Take 10 mg by mouth daily as needed for allergies.     metoprolol succinate (TOPROL XL) 25 MG 24 hr tablet Take 1 tablet (25 mg total) by mouth daily. 30 tablet 5   Multiple Vitamin (MULTIVITAMIN WITH MINERALS) TABS tablet Take 1 tablet by mouth daily.     Omega-3 1000 MG CAPS Take 1,000 mg by mouth daily.     oxymetazoline (AFRIN) 0.05 % nasal spray Place 1 spray into both nostrils 2 (two) times daily as needed for congestion.     phenylephrine (SUDAFED PE) 10 MG TABS tablet Take 10 mg by mouth  every 4 (four) hours as needed (congestion.).     Polyethyl Glycol-Propyl Glycol (LUBRICANT EYE DROPS) 0.4-0.3 % SOLN Place 1-2 drops into both eyes 3 (three) times daily as needed (dry/irritated eyes.).     rivaroxaban (XARELTO) 10 MG TABS tablet Take 1 tablet (10 mg total) by mouth daily. 30 tablet 6   rosuvastatin (CRESTOR) 10 MG tablet Take 10 mg by mouth at bedtime.      sildenafil (REVATIO) 20 MG tablet Take 60-80 mg by mouth daily as needed (erectile dysfunction.).     No current facility-administered medications for this visit.    Physical Exam BP (!) 147/84 (BP Location: Left Arm, Patient Position: Sitting)   Pulse 68   Resp 18   Ht 6\' 5"  (1.956 m)   Wt 212 lb (96.2 kg)   SpO2 96% Comment: RA  BMI 25.76 kg/m  77 year old man in no acute distress Alert and oriented x 3 with no focal deficits Lungs clear with equal breath sounds bilaterally No carotid bruits Cardiac regular rate and rhythm with normal S1 and S2 No peripheral edema  Diagnostic Tests: CT ANGIOGRAPHY CHEST WITH CONTRAST   TECHNIQUE: Multidetector CT imaging of the chest was performed using the standard  protocol during bolus administration of intravenous contrast. Multiplanar CT image reconstructions and MIPs were obtained to evaluate the vascular anatomy.   RADIATION DOSE REDUCTION: This exam was performed according to the departmental dose-optimization program which includes automated exposure control, adjustment of the mA and/or kV according to patient size and/or use of iterative reconstruction technique.   CONTRAST:  75mL ISOVUE-370 IOPAMIDOL (ISOVUE-370) INJECTION 76%   COMPARISON:  01/08/2022   FINDINGS: Cardiovascular: Ascending thoracic aortic aneurysm 4.7 cm in diameter on image 60 of series 4. Measuring this same level on last year's exam arrive at a measurement of 4.6 cm.   Coronary, aortic arch, and branch vessel atherosclerotic vascular disease. Faint calcifications along the aortic  valve. Upper normal heart size. No acute aortic findings.   Mediastinum/Nodes: Hypodense thyroid nodules up to 1.4 cm in diameter. Not clinically significant; no follow-up imaging recommended (ref: J Am Coll Radiol. 2015 Feb;12(2): 143-50).Calcified paratracheal, right hilar, subcarinal, and right infrahilar lymph nodes compatible with old granulomatous disease.   Lungs/Pleura: Biapical pleuroparenchymal scarring. Calcified granuloma in the right middle lobe, benign. Mild scarring in the lingula. Mild atelectasis or interval scarring in the right lower lobe.   A left lower lobe subpleural nodule along the diaphragm measures 7 by 5 by 5 mm (volume = 90 mm^3) on image 138 of series 7. This nodule is not changed compared to prior.   Upper Abdomen: No significant findings.   Musculoskeletal: Upper thoracic spondylosis and degenerative disc disease.   Review of the MIP images confirms the above findings.   IMPRESSION: 1. Ascending thoracic aortic aneurysm measures 4.7 cm in diameter, previously 4.6 cm. Recommend semi-annual imaging followup by CTA or MRA and referral to cardiothoracic surgery if not already obtained. This recommendation follows 2010 ACCF/AHA/AATS/ACR/ASA/SCA/SCAI/SIR/STS/SVM Guidelines for the Diagnosis and Management of Patients With Thoracic Aortic Disease. Circulation. 2010; 121: Z610-R604. Aortic aneurysm NOS (ICD10-I71.9) 2. Coronary, aortic arch, and branch vessel atherosclerotic vascular disease. 3. Old granulomatous disease. 4. Stable left lower lobe subpleural nodule along the diaphragm measuring 7 by 5 by 5 mm (volume = 90 mL). No further specific follow up is required. This recommendation follows the consensus statement: Guidelines for Management of Incidental Pulmonary Nodules Detected on CT Images: From the Fleischner Society 2017; Radiology 2017; 284:228-243. 5. Upper thoracic spondylosis and degenerative disc disease.   Aortic Atherosclerosis  (ICD10-I70.0).     Electronically Signed   By: Gaylyn Rong M.D.   On: 01/15/2023 12:00 I personally reviewed the chest CT images.  Stable ascending aorta about 4.5 cm by my measurement.  Aortic, great vessel, and coronary atherosclerosis.  Stable 7 x 5 x 5 mm subpleural nodule left lower lobe.  Impression: Antelmo Gabrielse is a 77 year old gentleman with a history of hyperlipidemia, DVT, PE, reflux, BPH, WPW, and an ascending aneurysm.   Ascending aneurysm-stable.  Measurements around 4.5 cm.  Will plan a another CT in 6 months as a radiologist read at 4.6 cm.  Hypertension-no history of hypertension but his blood pressure was elevated at 147 systolic.  Also in the 140s systolic at his recent visit with Dr. Leonides Schanz.  Recommended that we start low-dose metoprolol 25 mg p.o. daily.  DVT/PE-on Xarelto.  Atherosclerotic cardiovascular disease-on rosuvastatin.  Plan: Metoprolol succinate 25 mg p.o. daily Return in 6 months with CT chest  Loreli Slot, MD [facility] [phone]

## 2023-01-29 ENCOUNTER — Other Ambulatory Visit: Payer: Self-pay | Admitting: *Deleted

## 2023-01-29 MED ORDER — RIVAROXABAN 10 MG PO TABS: 10.0000 mg | ORAL_TABLET | Freq: Every day | ORAL | 6 refills | Status: DC

## 2023-07-09 ENCOUNTER — Other Ambulatory Visit: Payer: Self-pay | Admitting: Thoracic Surgery (Cardiothoracic Vascular Surgery)

## 2023-07-09 DIAGNOSIS — I7121 Aneurysm of the ascending aorta, without rupture: Secondary | ICD-10-CM

## 2023-07-12 ENCOUNTER — Other Ambulatory Visit: Payer: Self-pay | Admitting: Physician Assistant

## 2023-07-12 DIAGNOSIS — I2694 Multiple subsegmental pulmonary emboli without acute cor pulmonale: Secondary | ICD-10-CM

## 2023-07-13 ENCOUNTER — Inpatient Hospital Stay: Payer: Medicare Other | Attending: Physician Assistant

## 2023-07-13 ENCOUNTER — Inpatient Hospital Stay: Payer: Medicare Other | Admitting: Physician Assistant

## 2023-07-13 VITALS — BP 134/85 | HR 60 | Temp 97.3°F | Resp 13 | Wt 216.2 lb

## 2023-07-13 DIAGNOSIS — I2699 Other pulmonary embolism without acute cor pulmonale: Secondary | ICD-10-CM | POA: Diagnosis present

## 2023-07-13 DIAGNOSIS — Z79899 Other long term (current) drug therapy: Secondary | ICD-10-CM | POA: Diagnosis not present

## 2023-07-13 DIAGNOSIS — I2694 Multiple subsegmental pulmonary emboli without acute cor pulmonale: Secondary | ICD-10-CM

## 2023-07-13 DIAGNOSIS — Z7901 Long term (current) use of anticoagulants: Secondary | ICD-10-CM | POA: Diagnosis not present

## 2023-07-13 DIAGNOSIS — Z86711 Personal history of pulmonary embolism: Secondary | ICD-10-CM | POA: Diagnosis not present

## 2023-07-13 DIAGNOSIS — Z86718 Personal history of other venous thrombosis and embolism: Secondary | ICD-10-CM | POA: Insufficient documentation

## 2023-07-13 LAB — CBC WITH DIFFERENTIAL (CANCER CENTER ONLY)
Abs Immature Granulocytes: 0.03 10*3/uL (ref 0.00–0.07)
Basophils Absolute: 0.1 10*3/uL (ref 0.0–0.1)
Basophils Relative: 1 %
Eosinophils Absolute: 0.1 10*3/uL (ref 0.0–0.5)
Eosinophils Relative: 1 %
HCT: 43.4 % (ref 39.0–52.0)
Hemoglobin: 14.9 g/dL (ref 13.0–17.0)
Immature Granulocytes: 1 %
Lymphocytes Relative: 23 %
Lymphs Abs: 1.5 10*3/uL (ref 0.7–4.0)
MCH: 33 pg (ref 26.0–34.0)
MCHC: 34.3 g/dL (ref 30.0–36.0)
MCV: 96 fL (ref 80.0–100.0)
Monocytes Absolute: 0.6 10*3/uL (ref 0.1–1.0)
Monocytes Relative: 10 %
Neutro Abs: 4.3 10*3/uL (ref 1.7–7.7)
Neutrophils Relative %: 64 %
Platelet Count: 194 10*3/uL (ref 150–400)
RBC: 4.52 MIL/uL (ref 4.22–5.81)
RDW: 12.7 % (ref 11.5–15.5)
WBC Count: 6.6 10*3/uL (ref 4.0–10.5)
nRBC: 0 % (ref 0.0–0.2)

## 2023-07-13 LAB — CMP (CANCER CENTER ONLY)
ALT: 24 U/L (ref 0–44)
AST: 27 U/L (ref 15–41)
Albumin: 4.3 g/dL (ref 3.5–5.0)
Alkaline Phosphatase: 59 U/L (ref 38–126)
Anion gap: 3 — ABNORMAL LOW (ref 5–15)
BUN: 17 mg/dL (ref 8–23)
CO2: 32 mmol/L (ref 22–32)
Calcium: 9.2 mg/dL (ref 8.9–10.3)
Chloride: 105 mmol/L (ref 98–111)
Creatinine: 0.98 mg/dL (ref 0.61–1.24)
GFR, Estimated: >60 mL/min (ref >60)
Glucose, Bld: 95 mg/dL (ref 70–99)
Potassium: 4.4 mmol/L (ref 3.5–5.1)
Sodium: 140 mmol/L (ref 135–145)
Total Bilirubin: 0.9 mg/dL (ref 0.0–1.2)
Total Protein: 7 g/dL (ref 6.5–8.1)

## 2023-07-13 MED ORDER — RIVAROXABAN 10 MG PO TABS: 10.0000 mg | ORAL_TABLET | Freq: Every day | ORAL | 6 refills | Status: DC

## 2023-07-13 NOTE — Progress Notes (Signed)
 [facility] [phone]   Fax:([phone]  PROGRESS NOTE  Patient Care Team: Rae Bugler, MD as PCP - General (Family Medicine)  Hematological/Oncological History # Bilateral pulmonary emboli  # Recurrent VTEs 12/2020: Diagnosed with DVT and PE after driving to [place] in October 2022. Received anticoagulation for 3 months.  01/08/2022: CT angio chest showed segmental and subsegmental pulmonary emboli in the right lower lobe and subsegmental pulmonary emboli in the left upper lobe 02/12/2022: establish care with Dr. Rosaline Coma   CHIEF COMPLAINTS/PURPOSE OF CONSULTATION:  "Bilateral pulmonary emboli "  HISTORY OF PRESENTING ILLNESS:  Brian Rush 78 y.o. male returns for a follow up for history of PE/DVT. He is unaccompanied for this visit. He was last seen on 01/14/2023. In the interim since our last visit he has had no major changes in his health.  Mr .Felicie Horning reports he is doing well and tolerating Xarelto  therapy without any toxicities. His energy and appetite are overall stable. He is able complete his baseline ADLs independently. He tries to go for walks regularly. He denies any nausea, vomiting or bowel habit changes. He denies easy bruising or overt signs of bleeding. He reports the cost of his medication is manageable without any issues.  He has no signs or symptoms for recurrent VTEs. He otherwise denies any fevers, chills, sweats, shortness of breath, chest pain or cough.  A full 10 point ROS is otherwise negative.  MEDICAL HISTORY:  Past Medical History:  Diagnosis Date   Allergy    Arthritis    GERD (gastroesophageal reflux disease)    Headache    Brian Rush Parkinson White pattern seen on electrocardiogram     SURGICAL HISTORY: Past Surgical History:  Procedure Laterality Date   JOINT REPLACEMENT Right    2011   TOTAL KNEE ARTHROPLASTY Left 06/02/2019   Procedure: TOTAL KNEE ARTHROPLASTY;  Surgeon: Marlena Sima, MD;  Location:  [facility];  Service: Orthopedics;  Laterality: Left;    SOCIAL HISTORY: Social History   Socioeconomic History   Marital status: Married    Spouse name: Not on file   Number of children: Not on file   Years of education: Not on file   Highest education level: Not on file  Occupational History   Not on file  Tobacco Use   Smoking status: Never   Smokeless tobacco: Never  Substance and Sexual Activity   Alcohol use: Yes    Alcohol/week: 1.0 standard drink of alcohol    Types: 1 Glasses of wine per week    Comment: 4 glasses per week   Drug use: Never   Sexual activity: Not on file  Other Topics Concern   Not on file  Social History Narrative   Not on file   Social Drivers of Health   Financial Resource Strain: Not on file  Food Insecurity: Not on file  Transportation Needs: Not on file  Physical Activity: Not on file  Stress: Not on file  Social Connections: Not on file  Intimate Partner Violence: Not on file    FAMILY HISTORY: No family history on file.  ALLERGIES:  is allergic to latex and quinolones.  MEDICATIONS:  Current Outpatient Medications  Medication Sig Dispense Refill   Cholecalciferol (VITAMIN D3) 50 MCG (2000 UT) TABS Take 2,000 Units by mouth daily.     Coenzyme Q10 (COQ10) 100 MG CAPS Take 100 mg by mouth daily.     fluticasone (FLONASE) 50 MCG/ACT nasal spray Place 1-2 sprays into both nostrils  daily as needed for allergies or rhinitis.     ketotifen (ZADITOR) 0.025 % ophthalmic solution Place 1 drop into both eyes 2 (two) times daily as needed (allergies.).     loratadine  (CLARITIN ) 10 MG tablet Take 10 mg by mouth daily as needed for allergies.     metoprolol  succinate (TOPROL  XL) 25 MG 24 hr tablet Take 1 tablet (25 mg total) by mouth daily. 30 tablet 5   Multiple Vitamin (MULTIVITAMIN WITH MINERALS) TABS tablet Take 1 tablet by mouth daily.     Omega-3 1000 MG CAPS Take 1,000 mg by mouth daily.     oxymetazoline (AFRIN) 0.05 % nasal spray Place  1 spray into both nostrils 2 (two) times daily as needed for congestion.     phenylephrine  (SUDAFED PE) 10 MG TABS tablet Take 10 mg by mouth every 4 (four) hours as needed (congestion.).     Polyethyl Glycol-Propyl Glycol (LUBRICANT EYE DROPS) 0.4-0.3 % SOLN Place 1-2 drops into both eyes 3 (three) times daily as needed (dry/irritated eyes.).     rivaroxaban  (XARELTO ) 10 MG TABS tablet Take 1 tablet (10 mg total) by mouth daily. 30 tablet 6   rosuvastatin  (CRESTOR ) 10 MG tablet Take 10 mg by mouth at bedtime.      sildenafil (REVATIO) 20 MG tablet Take 60-80 mg by mouth daily as needed (erectile dysfunction.).     No current facility-administered medications for this visit.    REVIEW OF SYSTEMS:   Constitutional: ( - ) fevers, ( - )  chills , ( - ) night sweats Eyes: ( - ) blurriness of vision, ( - ) double vision, ( - ) watery eyes Ears, nose, mouth, throat, and face: ( - ) mucositis, ( - ) sore throat Respiratory: ( - ) cough, ( - ) dyspnea, ( - ) wheezes Cardiovascular: ( - ) palpitation, ( - ) chest discomfort, ( - ) lower extremity swelling Gastrointestinal:  ( - ) nausea, ( - ) heartburn, ( - ) change in bowel habits Skin: ( - ) abnormal skin rashes Lymphatics: ( - ) new lymphadenopathy, ( - ) easy bruising Neurological: ( - ) numbness, ( - ) tingling, ( - ) new weaknesses Behavioral/Psych: ( - ) mood change, ( - ) new changes  All other systems were reviewed with the patient and are negative.  PHYSICAL EXAMINATION:  Vitals:   07/13/23 1505  BP: 134/85  Pulse: 60  Resp: 13  Temp: (!) 97.3 F (36.3 C)  SpO2: 100%    Filed Weights   07/13/23 1505  Weight: 216 lb 3.2 oz (98.1 kg)     GENERAL: well appearing elderly Caucasian male in NAD  SKIN: skin color, texture, turgor are normal, no rashes or significant lesions EYES: conjunctiva are pink and non-injected, sclera clear LUNGS: clear to auscultation and percussion with normal breathing effort HEART: regular rate &  rhythm and no murmurs. R>L lower extremity edema Musculoskeletal: no cyanosis of digits and no clubbing  PSYCH: alert & oriented x 3, fluent speech NEURO: no focal motor/sensory deficits  LABORATORY DATA:  I have reviewed the data as listed    Latest Ref Rng & Units 07/13/2023    2:45 PM 01/14/2023    2:53 PM 10/15/2022    3:36 PM  CBC  WBC 4.0 - 10.5 K/uL 6.6  7.3  6.2   Hemoglobin 13.0 - 17.0 g/dL 16.1  09.6  04.5   Hematocrit 39.0 - 52.0 % 43.4  46.2  43.4  Platelets 150 - 400 K/uL 194  194  200        Latest Ref Rng & Units 01/14/2023    2:53 PM 10/15/2022    3:36 PM 07/15/2022    2:51 PM  CMP  Glucose 70 - 99 mg/dL 161  96  94   BUN 8 - 23 mg/dL 16  15  16    Creatinine 0.61 - 1.24 mg/dL 0.96  0.45  4.09   Sodium 135 - 145 mmol/L 140  141  143   Potassium 3.5 - 5.1 mmol/L 4.4  4.2  4.5   Chloride 98 - 111 mmol/L 103  105  106   CO2 22 - 32 mmol/L 32  31  34   Calcium  8.9 - 10.3 mg/dL 9.7  9.2  9.1   Total Protein 6.5 - 8.1 g/dL 7.3  6.9  6.6   Total Bilirubin <1.2 mg/dL 0.9  0.7  0.7   Alkaline Phos 38 - 126 U/L 58  58  52   AST 15 - 41 U/L 27  24  24    ALT 0 - 44 U/L 21  19  20       ASSESSMENT & PLAN Dayquan Buys is a 78 y.o. male returns for a follow up for history of recurrent VTE.  #Unprovoked Pulmonary Embolism  # Recurrent VTEs  --findings at this time are consistent with a unprovoked VTE  --labs today were reviewed and require no intervention. --patient has completed 6 months of full strength Eliquis therapy  --labs today show white blood cell 6.6, hemoglobin 14.9, MCV 96.0, platelets 194. Creatinine and LFTs in range.  -- continue Xarelto  10 mg daily maintenance therapy (switched to Xarelto  for once daily dosing). Refill sent today --RTC in 6 months' time with strict return precautions for overt signs of bleeding.   No orders of the defined types were placed in this encounter.   All questions were answered. The patient knows to call the clinic  with any problems, questions or concerns.   I have spent a total of 25 minutes minutes of face-to-face and non-face-to-face time, preparing to see the patient, performing a medically appropriate examination, counseling and educating the patient, ordering medications/tests/procedures,documenting clinical information in the electronic health record, independently interpreting results and communicating results to the patient, and care coordination.   Wyline Hearing PA-C Dept of Hematology and Oncology [facility] Phone: [phone]     07/13/2023 3:28 PM

## 2023-07-15 ENCOUNTER — Ambulatory Visit: Payer: Self-pay | Admitting: Physician Assistant

## 2023-07-20 NOTE — Patient Instructions (Signed)
Patient is counseled regarding the importance of long term risk factor modification as they pertain to the presence of ischemic heart disease including avoiding the use of all tobacco products, dietary modifications and medical therapy for diabetes, cholesterol and lipid management, and regular exercise.

## 2023-07-20 NOTE — Progress Notes (Signed)
 [address]       [place] [address]             [phone]        [name] [id number] 08/23/1945  History of Present Illness:  Brian Rush is a 78 year old gentleman with a history of hyperlipidemia, DVT, PE, reflux, BPH, WPW, and an ascending aneurysm.  No prior history of hypertension.   Found to have a DVT about 2 years ago.  Was having some shortness of breath.  Had a CT which showed a 4.4 cm ascending aneurysm.  Later after a flight to [place] he was found to have bilateral pulmonary emboli.  He also is known to have a pulmonary nodule measuring 7 x 5 x 5 in the left lower lobe which has been stable.  He presents today for 6 month follow up.  Overall he is doing well.  He denies chest pain and shortness of breath.  He is compliant on medications.  Denies family history of Aneurysm.  Current Outpatient Medications on File Prior to Visit  Medication Sig Dispense Refill   Cholecalciferol (VITAMIN D3) 50 MCG (2000 UT) TABS Take 2,000 Units by mouth daily.     Coenzyme Q10 (COQ10) 100 MG CAPS Take 100 mg by mouth daily.     fluticasone (FLONASE) 50 MCG/ACT nasal spray Place 1-2 sprays into both nostrils daily as needed for allergies or rhinitis.     ketotifen (ZADITOR) 0.025 % ophthalmic solution Place 1 drop into both eyes 2 (two) times daily as needed (allergies.).     loratadine  (CLARITIN ) 10 MG tablet Take 10 mg by mouth daily as needed for allergies.     metoprolol  succinate (TOPROL  XL) 25 MG 24 hr tablet Take 1 tablet (25 mg total) by mouth daily. 30 tablet 5   Multiple Vitamin (MULTIVITAMIN WITH MINERALS) TABS tablet Take 1 tablet by mouth daily.     Omega-3 1000 MG CAPS Take 1,000 mg by mouth daily.     oxymetazoline (AFRIN) 0.05 % nasal spray Place 1 spray into both nostrils 2 (two) times daily as needed for congestion.     phenylephrine  (SUDAFED PE) 10 MG TABS tablet Take 10 mg by mouth every 4 (four) hours as needed (congestion.).      Polyethyl Glycol-Propyl Glycol (LUBRICANT EYE DROPS) 0.4-0.3 % SOLN Place 1-2 drops into both eyes 3 (three) times daily as needed (dry/irritated eyes.).     rivaroxaban  (XARELTO ) 10 MG TABS tablet Take 1 tablet (10 mg total) by mouth daily. 30 tablet 6   rosuvastatin  (CRESTOR ) 10 MG tablet Take 10 mg by mouth at bedtime.      sildenafil (REVATIO) 20 MG tablet Take 60-80 mg by mouth daily as needed (erectile dysfunction.).     No current facility-administered medications on file prior to visit.   Physical Exam  BP (!) 143/81 (BP Location: Right Arm, Patient Position: Sitting, Cuff Size: Normal)   Pulse 66   Resp 20   Ht 6\' 5"  (1.956 m)   SpO2 97% Comment: RA  BMI 25.64 kg/m   Gen; NAD Heart: RRR Neck: no carotid bruit Abd: soft non-tender, non-distended Ext: no edmea Neuro: grossly intact  CTA Results:  FINDINGS:   Stable 4.6 cm ascending thoracic aorta.   Aortic arch and descending thoracic aorta remain normal in caliber. Scattered atherosclerotic changes are present. The origins of the great vessels are patent.   The visualized thyroid  contain small nodules. The  main pulmonary artery is normal in caliber. The heart is enlarged. There are coronary calcifications. There is no free fluid or lymphadenopathy by size criteria. There are calcified mediastinal and right hilar lymph nodes. The visualized upper abdominal structures appear within normal limits. Trachea and mainstem bronchi are patent. Subsegmental atelectatic changes are noted within the lung bases. Calcified granuloma noted in the right middle lobe. Stable 4 mm left lower lobe pulmonary nodule. The lungs are otherwise clear. There are degenerative changes of the spine which are mild.   IMPRESSION:   Stable dilatation of the ascending thoracic aorta measuring 4.6 cm.   DOSE REDUCTION: This exam was performed according to our departmental dose-optimization program which includes automated exposure control,  adjustment of the mA and/or kV according to patient size and/or use of iterative reconstruction technique.   Electronically signed by: [place] Engel MD 07/28/2023 06:15 PM EDT RP Workstation: [id number]    A/P:  Ascending Aortic Aneurysm- measuring 4.6 cm which is stable Left Lower Lobe Pulmonary Nodule-measuring 4 mm and is stable H/O DVT/PE BPH WPW- per cardiology  RTC in 6 months with repeat CTA   Risk Modification:  Patient was counseled on importance of Blood Pressure Control.  Despite Medical intervention if the patient notices persistently elevated blood pressure readings.  They are instructed to contact their Primary Care Physician  Please avoid use of Fluoroquinolones as this can potentially increase your risk of Aortic Rupture and/or Dissection  Patient educated on signs and symptoms of Aortic Dissection, handout also provided in AVS  Rhian Asebedo, PA-C 07/20/23

## 2023-07-27 ENCOUNTER — Other Ambulatory Visit (HOSPITAL_COMMUNITY)

## 2023-07-28 ENCOUNTER — Ambulatory Visit (HOSPITAL_COMMUNITY)
Admission: RE | Admit: 2023-07-28 | Discharge: 2023-07-28 | Disposition: A | Discharge status: Home / Self Care | Source: Ambulatory Visit | Attending: Thoracic Surgery (Cardiothoracic Vascular Surgery) | Admitting: Thoracic Surgery (Cardiothoracic Vascular Surgery)

## 2023-07-28 DIAGNOSIS — I7121 Aneurysm of the ascending aorta, without rupture: Secondary | ICD-10-CM | POA: Diagnosis present

## 2023-07-28 MED ORDER — IOHEXOL 350 MG/ML SOLN
75.0000 mL | Freq: Once | INTRAVENOUS | Status: AC | PRN
  Administered 2023-07-28: 75 mL via INTRAVENOUS

## 2023-08-03 ENCOUNTER — Ambulatory Visit: Attending: Thoracic Surgery (Cardiothoracic Vascular Surgery) | Admitting: Physician Assistant

## 2023-08-03 VITALS — BP 143/81 | HR 66 | Resp 20 | Ht 77.0 in

## 2023-08-03 DIAGNOSIS — I7121 Aneurysm of the ascending aorta, without rupture: Secondary | ICD-10-CM

## 2023-09-07 ENCOUNTER — Other Ambulatory Visit: Payer: Self-pay | Admitting: Thoracic Surgery (Cardiothoracic Vascular Surgery)

## 2023-09-09 ENCOUNTER — Other Ambulatory Visit: Payer: Self-pay

## 2023-09-09 MED ORDER — METOPROLOL SUCCINATE ER 25 MG PO TB24: 25.0000 mg | ORAL_TABLET | Freq: Every day | ORAL | 5 refills | Status: AC

## 2023-09-09 NOTE — Progress Notes (Signed)
 Patient contacted the office to state he was out of Metoprolol . Dr. Kerrin started patient on medication last year for hypertension. Patient's prescription sent in electronically. Dr. Kerrin is aware.

## 2023-09-10 ENCOUNTER — Telehealth: Payer: Self-pay

## 2023-09-10 NOTE — Telephone Encounter (Signed)
-----   Message from Elspeth JAYSON Millers sent at 09/09/2023  7:27 PM EDT ----- Regarding: RE: Metoprolol  refill PCP can handle it ----- Message ----- From: Carvin Rosina FALCON, RN Sent: 09/09/2023   2:25 PM EDT To: Elspeth JAYSON Millers, MD Subject: Metoprolol  refill                              Hey,  He called asking for a refill of Metoprolol . Looks like we started him on it back last year. I did send in a refill, but for future do you want to continue to refill? Or refer to his PCP?  Thanks, Rosina

## 2024-01-05 ENCOUNTER — Other Ambulatory Visit: Payer: Self-pay | Admitting: Thoracic Surgery (Cardiothoracic Vascular Surgery)

## 2024-01-05 DIAGNOSIS — I7121 Aneurysm of the ascending aorta, without rupture: Secondary | ICD-10-CM

## 2024-01-10 ENCOUNTER — Other Ambulatory Visit: Payer: Self-pay | Admitting: Physician Assistant

## 2024-01-10 DIAGNOSIS — Z86711 Personal history of pulmonary embolism: Secondary | ICD-10-CM

## 2024-01-11 ENCOUNTER — Inpatient Hospital Stay: Attending: Hematology and Oncology

## 2024-01-11 ENCOUNTER — Inpatient Hospital Stay: Admitting: Physician Assistant

## 2024-01-11 VITALS — BP 132/82 | HR 58 | Temp 97.4°F | Resp 13 | Wt 212.6 lb

## 2024-01-11 DIAGNOSIS — R600 Localized edema: Secondary | ICD-10-CM | POA: Insufficient documentation

## 2024-01-11 DIAGNOSIS — Z86711 Personal history of pulmonary embolism: Secondary | ICD-10-CM | POA: Insufficient documentation

## 2024-01-11 DIAGNOSIS — I2693 Single subsegmental pulmonary embolism without acute cor pulmonale: Secondary | ICD-10-CM | POA: Insufficient documentation

## 2024-01-11 DIAGNOSIS — Z7901 Long term (current) use of anticoagulants: Secondary | ICD-10-CM | POA: Insufficient documentation

## 2024-01-11 DIAGNOSIS — Z86718 Personal history of other venous thrombosis and embolism: Secondary | ICD-10-CM | POA: Insufficient documentation

## 2024-01-11 DIAGNOSIS — R040 Epistaxis: Secondary | ICD-10-CM | POA: Diagnosis not present

## 2024-01-11 DIAGNOSIS — I82409 Acute embolism and thrombosis of unspecified deep veins of unspecified lower extremity: Secondary | ICD-10-CM | POA: Diagnosis not present

## 2024-01-11 DIAGNOSIS — Z79899 Other long term (current) drug therapy: Secondary | ICD-10-CM | POA: Insufficient documentation

## 2024-01-11 LAB — CBC WITH DIFFERENTIAL (CANCER CENTER ONLY)
Abs Immature Granulocytes: 0.01 K/uL (ref 0.00–0.07)
Basophils Absolute: 0.1 K/uL (ref 0.0–0.1)
Basophils Relative: 1 %
Eosinophils Absolute: 0.1 K/uL (ref 0.0–0.5)
Eosinophils Relative: 2 %
HCT: 43.5 % (ref 39.0–52.0)
Hemoglobin: 14.8 g/dL (ref 13.0–17.0)
Immature Granulocytes: 0 %
Lymphocytes Relative: 24 %
Lymphs Abs: 1.4 K/uL (ref 0.7–4.0)
MCH: 33 pg (ref 26.0–34.0)
MCHC: 34 g/dL (ref 30.0–36.0)
MCV: 97.1 fL (ref 80.0–100.0)
Monocytes Absolute: 0.6 K/uL (ref 0.1–1.0)
Monocytes Relative: 10 %
Neutro Abs: 3.9 K/uL (ref 1.7–7.7)
Neutrophils Relative %: 63 %
Platelet Count: 197 K/uL (ref 150–400)
RBC: 4.48 MIL/uL (ref 4.22–5.81)
RDW: 12.9 % (ref 11.5–15.5)
WBC Count: 6 K/uL (ref 4.0–10.5)
nRBC: 0 % (ref 0.0–0.2)

## 2024-01-11 LAB — CMP (CANCER CENTER ONLY)
ALT: 18 U/L (ref 0–44)
AST: 26 U/L (ref 15–41)
Albumin: 4.1 g/dL (ref 3.5–5.0)
Alkaline Phosphatase: 56 U/L (ref 38–126)
Anion gap: 3 — ABNORMAL LOW (ref 5–15)
BUN: 13 mg/dL (ref 8–23)
CO2: 31 mmol/L (ref 22–32)
Calcium: 9.3 mg/dL (ref 8.9–10.3)
Chloride: 106 mmol/L (ref 98–111)
Creatinine: 0.89 mg/dL (ref 0.61–1.24)
GFR, Estimated: >60 mL/min (ref >60)
Glucose, Bld: 99 mg/dL (ref 70–99)
Potassium: 4.4 mmol/L (ref 3.5–5.1)
Sodium: 140 mmol/L (ref 135–145)
Total Bilirubin: 1.1 mg/dL (ref 0.0–1.2)
Total Protein: 6.5 g/dL (ref 6.5–8.1)

## 2024-01-11 MED ORDER — RIVAROXABAN 10 MG PO TABS: 10.0000 mg | ORAL_TABLET | Freq: Every day | ORAL | 6 refills | Status: AC

## 2024-01-11 NOTE — Progress Notes (Signed)
 [facility] Telephone:(336) [phone]   Fax:(336) [phone]  PROGRESS NOTE  Patient Care Team: Seabron Lenis, MD as PCP - General (Family Medicine)  Hematological/Oncological History # Bilateral pulmonary emboli  # Recurrent VTEs 12/2020: Diagnosed with DVT and PE after driving to [place] in October 2022. Received anticoagulation for 3 months.  01/08/2022: CT angio chest showed segmental and subsegmental pulmonary emboli in the right lower lobe and subsegmental pulmonary emboli in the left upper lobe 02/12/2022: establish care with Dr. Federico   CHIEF COMPLAINTS/PURPOSE OF CONSULTATION:  Bilateral pulmonary emboli   HISTORY OF PRESENTING ILLNESS:  Brian Rush 78 y.o. male returns for a follow up for history of PE/DVT. He is unaccompanied for this visit. He was last seen on 07/13/2023. In the interim since our last visit he has had no major changes in his health.  Mr .Wonda reports he is doing well and tolerating Xarelto  therapy without any toxicities.  He reports the cost of the medication increased after 6 months of enrollment at the co-pay program.  He denies any signs or symptoms of recurrent VTE.  He does have lower extremity edema that develops by the end of the day but resolves in the morning.  He reports having intermittent episodes of nosebleeds that have flared up with the dry weather.  He denies any easy bruising or other overt signs of bleeding.  He denies any fevers, chills, sweats, shortness of breath, chest pain or cough.  A full 10 point ROS is otherwise negative.  MEDICAL HISTORY:  Past Medical History:  Diagnosis Date   Allergy    Arthritis    GERD (gastroesophageal reflux disease)    Headache    Kassie Parkinson White pattern seen on electrocardiogram     SURGICAL HISTORY: Past Surgical History:  Procedure Laterality Date   JOINT REPLACEMENT Right    2011   TOTAL KNEE ARTHROPLASTY Left 06/02/2019   Procedure: TOTAL KNEE ARTHROPLASTY;   Surgeon: Shari Sieving, MD;  Location: [facility];  Service: Orthopedics;  Laterality: Left;    SOCIAL HISTORY: Social History   Socioeconomic History   Marital status: Married    Spouse name: Not on file   Number of children: Not on file   Years of education: Not on file   Highest education level: Not on file  Occupational History   Not on file  Tobacco Use   Smoking status: Never   Smokeless tobacco: Never  Substance and Sexual Activity   Alcohol use: Yes    Alcohol/week: 1.0 standard drink of alcohol    Types: 1 Glasses of wine per week    Comment: 4 glasses per week   Drug use: Never   Sexual activity: Not on file  Other Topics Concern   Not on file  Social History Narrative   Not on file   Social Drivers of Health   Financial Resource Strain: Not on file  Food Insecurity: Not on file  Transportation Needs: Not on file  Physical Activity: Not on file  Stress: Not on file  Social Connections: Not on file  Intimate Partner Violence: Not on file    FAMILY HISTORY: No family history on file.  ALLERGIES:  is allergic to latex and quinolones.  MEDICATIONS:  Current Outpatient Medications  Medication Sig Dispense Refill   Cholecalciferol (VITAMIN D3) 50 MCG (2000 UT) TABS Take 2,000 Units by mouth daily.     Coenzyme Q10 (COQ10) 100 MG CAPS Take 100 mg by mouth daily.  fluticasone (FLONASE) 50 MCG/ACT nasal spray Place 1-2 sprays into both nostrils daily as needed for allergies or rhinitis.     ketotifen (ZADITOR) 0.025 % ophthalmic solution Place 1 drop into both eyes 2 (two) times daily as needed (allergies.).     loratadine  (CLARITIN ) 10 MG tablet Take 10 mg by mouth daily as needed for allergies.     metoprolol  succinate (TOPROL  XL) 25 MG 24 hr tablet Take 1 tablet (25 mg total) by mouth daily. 30 tablet 5   Multiple Vitamin (MULTIVITAMIN WITH MINERALS) TABS tablet Take 1 tablet by mouth daily.     Omega-3 1000 MG CAPS Take 1,000 mg by mouth daily.      oxymetazoline (AFRIN) 0.05 % nasal spray Place 1 spray into both nostrils 2 (two) times daily as needed for congestion.     phenylephrine  (SUDAFED PE) 10 MG TABS tablet Take 10 mg by mouth every 4 (four) hours as needed (congestion.).     Polyethyl Glycol-Propyl Glycol (LUBRICANT EYE DROPS) 0.4-0.3 % SOLN Place 1-2 drops into both eyes 3 (three) times daily as needed (dry/irritated eyes.).     rosuvastatin  (CRESTOR ) 10 MG tablet Take 10 mg by mouth at bedtime.      sildenafil (REVATIO) 20 MG tablet Take 60-80 mg by mouth daily as needed (erectile dysfunction.).     rivaroxaban  (XARELTO ) 10 MG TABS tablet Take 1 tablet (10 mg total) by mouth daily. 30 tablet 6   No current facility-administered medications for this visit.    REVIEW OF SYSTEMS:   Constitutional: ( - ) fevers, ( - )  chills , ( - ) night sweats Eyes: ( - ) blurriness of vision, ( - ) double vision, ( - ) watery eyes Ears, nose, mouth, throat, and face: ( - ) mucositis, ( - ) sore throat Respiratory: ( - ) cough, ( - ) dyspnea, ( - ) wheezes Cardiovascular: ( - ) palpitation, ( - ) chest discomfort, ( +) lower extremity swelling Gastrointestinal:  ( - ) nausea, ( - ) heartburn, ( - ) change in bowel habits Skin: ( - ) abnormal skin rashes Lymphatics: ( - ) new lymphadenopathy, ( - ) easy bruising Neurological: ( - ) numbness, ( - ) tingling, ( - ) new weaknesses Behavioral/Psych: ( - ) mood change, ( - ) new changes  All other systems were reviewed with the patient and are negative.  PHYSICAL EXAMINATION:  Vitals:   01/11/24 1012  BP: 132/82  Pulse: (!) 58  Resp: 13  Temp: (!) 97.4 F (36.3 C)  SpO2: 97%     Filed Weights   01/11/24 1012  Weight: 212 lb 9.6 oz (96.4 kg)      GENERAL: well appearing elderly Caucasian male in NAD  SKIN: skin color, texture, turgor are normal, no rashes or significant lesions EYES: conjunctiva are pink and non-injected, sclera clear LUNGS: clear to auscultation and percussion  with normal breathing effort HEART: regular rate & rhythm and no murmurs. R>L lower extremity edema Musculoskeletal: no cyanosis of digits and no clubbing  PSYCH: alert & oriented x 3, fluent speech NEURO: no focal motor/sensory deficits  LABORATORY DATA:  I have reviewed the data as listed    Latest Ref Rng & Units 01/11/2024    9:48 AM 07/13/2023    2:45 PM 01/14/2023    2:53 PM  CBC  WBC 4.0 - 10.5 K/uL 6.0  6.6  7.3   Hemoglobin 13.0 - 17.0 g/dL 85.1  14.9  15.5   Hematocrit 39.0 - 52.0 % 43.5  43.4  46.2   Platelets 150 - 400 K/uL 197  194  194        Latest Ref Rng & Units 07/13/2023    2:45 PM 01/14/2023    2:53 PM 10/15/2022    3:36 PM  CMP  Glucose 70 - 99 mg/dL 95  899  96   BUN 8 - 23 mg/dL 17  16  15    Creatinine 0.61 - 1.24 mg/dL 9.01  9.08  9.11   Sodium 135 - 145 mmol/L 140  140  141   Potassium 3.5 - 5.1 mmol/L 4.4  4.4  4.2   Chloride 98 - 111 mmol/L 105  103  105   CO2 22 - 32 mmol/L 32  32  31   Calcium  8.9 - 10.3 mg/dL 9.2  9.7  9.2   Total Protein 6.5 - 8.1 g/dL 7.0  7.3  6.9   Total Bilirubin 0.0 - 1.2 mg/dL 0.9  0.9  0.7   Alkaline Phos 38 - 126 U/L 59  58  58   AST 15 - 41 U/L 27  27  24    ALT 0 - 44 U/L 24  21  19       ASSESSMENT & PLAN Roshaun Pound is a 78 y.o. male returns for a follow up for history of recurrent VTE.  #Unprovoked Pulmonary Embolism  # Recurrent VTEs  --findings at this time are consistent with a unprovoked VTE  --patient completed 6 months of full strength Eliquis therapy and then switch to Xarelto  10 mg daily for convenience of once daily dosing. --labs today show WBC 6.0, hemoglobin 14.8, platelet 197, Creatinine and LFTs in range.  -- continue Xarelto  10 mg daily maintenance therapy. Refill sent today --RTC in 12 months' time with strict return precautions for overt signs of bleeding.   No orders of the defined types were placed in this encounter.   All questions were answered. The patient knows to call the  clinic with any problems, questions or concerns.   I have spent a total of 25 minutes minutes of face-to-face and non-face-to-face time, preparing to see the patient, performing a medically appropriate examination, counseling and educating the patient, ordering medications/tests/procedures,documenting clinical information in the electronic health record, independently interpreting results and communicating results to the patient, and care coordination.   Johnston Police PA-C Dept of Hematology and Oncology [facility] Phone: [phone]     01/11/2024 10:25 AM

## 2024-02-07 ENCOUNTER — Ambulatory Visit

## 2024-02-16 ENCOUNTER — Ambulatory Visit (HOSPITAL_COMMUNITY)
Admission: RE | Admit: 2024-02-16 | Discharge: 2024-02-16 | Disposition: A | Discharge status: Home / Self Care | Source: Ambulatory Visit | Attending: Cardiovascular Disease | Admitting: Cardiovascular Disease

## 2024-02-16 DIAGNOSIS — I7121 Aneurysm of the ascending aorta, without rupture: Secondary | ICD-10-CM | POA: Diagnosis present

## 2024-02-16 MED ORDER — IOHEXOL 350 MG/ML SOLN
75.0000 mL | Freq: Once | INTRAVENOUS | Status: AC | PRN
  Administered 2024-02-16: 75 mL via INTRAVENOUS

## 2024-03-01 ENCOUNTER — Ambulatory Visit

## 2024-03-01 VITALS — BP 144/74 | HR 59 | Resp 18 | Ht 77.0 in | Wt 210.0 lb

## 2024-03-01 DIAGNOSIS — I7121 Aneurysm of the ascending aorta, without rupture: Secondary | ICD-10-CM

## 2024-03-01 DIAGNOSIS — R911 Solitary pulmonary nodule: Secondary | ICD-10-CM

## 2024-03-01 NOTE — Progress Notes (Signed)
 "      [address] [place] [address]             [phone]            Brian Rush [id number] 08/10/1945   History of Present Illness:  Brian Rush is a 79 year old man with medical history of pulmonary embolism, DVT, GERD, BPH and hyperlipidemia who presents for continued follow-up of ascending thoracic aortic aneurysm.  Aneurysm was found incidentally when he was diagnosed with a DVT.  CT scan at that time showed that he had a 4.4 cm ascending aneurysm.  On recent CTA of chest aneurysm measured 4.2 cm.  Scan also showed a new 8 mm paramediastinal left upper lobe pulmonary nodule.   He presents to the clinic today and reports that he is doing well.  His blood pressure is slightly elevated at today's visit.  He does not have a history of hypertension.  He is very active with pickleball and is also an avid ski year.  He denies chest pain and shortness of breath.    Medications Ordered Prior to Encounter[1]   ROS: Review of Systems  Constitutional:  Negative for fever and malaise/fatigue.  Respiratory:  Negative for cough, shortness of breath and wheezing.   Cardiovascular:  Negative for chest pain and palpitations.     BP (!) 144/74 (BP Location: Left Arm)   Pulse (!) 59   Resp 18   Ht 6' 5 (1.956 m)   Wt 210 lb (95.3 kg)   SpO2 99%   BMI 24.90 kg/m   Physical Exam Constitutional:      Appearance: Normal appearance.  HENT:     Head: Normocephalic and atraumatic.  Cardiovascular:     Rate and Rhythm: Normal rate and regular rhythm.     Heart sounds: Normal heart sounds, S1 normal and S2 normal.  Pulmonary:     Effort: Pulmonary effort is normal.     Breath sounds: Normal breath sounds.  Skin:    General: Skin is warm and dry.  Neurological:     General: No focal deficit present.     Mental Status: He is alert and oriented to person, place, and time.       Imaging: EXAM: CTA CHEST AORTA 02/16/2024 10:13:33 AM    TECHNIQUE: CTA of the chest was performed after the administration of 75 mL of iohexol  (OMNIPAQUE ) 350 MG/ML injection. Multiplanar reformatted images are provided for review. MIP images are provided for review. Automated exposure control, iterative reconstruction, and/or weight based adjustment of the mA/kV was utilized to reduce the radiation dose to as low as reasonably achievable.   COMPARISON: 07/28/2023.   CLINICAL HISTORY: Aortic aneurysm suspected.   FINDINGS:   AORTA: Similar dilation of the ascending aorta, measuring 4.2 cm. Scattered calcified atherosclerosis in the aorta and great vessels. No thoracic aortic dissection.   MEDIASTINUM: Multivessel coronary atherosclerosis. Calcified right hilar and mediastinal lymph nodes, consistent with chronic granulomatous infection. The heart and pericardium demonstrate no acute abnormality.   LYMPH NODES: Calcified right hilar and mediastinal lymph nodes, consistent with chronic granulomatous infection. No axillary lymphadenopathy.   LUNGS AND PLEURA: Biapical pleuroparenchymal scarring, unchanged. Calcified right middle lobe granuloma. Fibrolinear scarring in the lung bases. Posterior bibasilar dependent atelectasis. No focal consolidation or pulmonary edema. No pleural effusion or pneumothorax. 8 x 7 mm paramediastinal left upper lobe nodule (axial 37).   UPPER ABDOMEN: No acute abnormality  within the partially visualized images of the upper abdomen.   SOFT TISSUES AND BONES: Small volume symmetric bilateral gynecomastia. No acute bone abnormality.   IMPRESSION: 1. Stable dilation of the ascending aorta, measuring 4.2 cm. 2. 8 mm paramediastinal left upper lobe nodule (axial 37). Follow up is recommended, as documented below.   Incidental single solid nodule 6-8 mm. Low risk: CT at 6-12 months, then consider CT at 18-24 months. High risk:  CT at 6-12 months, then CT 18-24 months. NOTE: These recommendations do  not apply to patients with immunosuppression or patients with known primary cancer. REFERENCE: Fleischner Society 2017 Guidelines for Management of Incidentally Detected Pulmonary Nodules in Adults. Radiology 2017.    These results will be called to the ordering clinician or representative by the radiologist assistant and communication documented in the pacs or clario dashboard.   Electronically signed by: Brian Myers MD 02/16/2024 10:44 AM EST RP Workstation: [id number]     A/P: Aneurysm of ascending aorta without rupture -4.2 cm ascending thoracic aortic aneurysm on CTA of chest.  -We discussed the natural history and and risk factors for growth of ascending aortic aneurysms. Discussed recommendations to minimize the risk of further expansion or dissection including careful blood pressure control, avoidance of contact sports and heavy lifting, attention to lipid management.  We covered the importance of staying never user of tobacco.  The patient does not yet meet surgical criteria of >5.5cm. The patient is aware of signs and symptoms of aortic dissection and when to present to the emergency department    Pulmonary nodule -new 8 mm paramediastinal left upper lobe nodule  -Follow up in 6 months with CT scan of chest without contrast for continued follow up   Risk Modification:  Statin:  rosuvastatin    Smoking cessation instruction/counseling given:  never user  Patient was counseled on importance of Blood Pressure Control  They are instructed to contact their Primary Care Physician if they start to have blood pressure readings over 130s/90s. Do not ever stop blood pressure medications on your own, unless instructed by healthcare professional.  Please avoid use of Fluoroquinolones as this can potentially increase your risk of Aortic Rupture and/or Dissection  Patient educated on signs and symptoms of Aortic Dissection, handout also provided in AVS  Brian CHRISTELLA Rough,  PA-C 03/03/2024     [1]  Current Outpatient Medications on File Prior to Visit  Medication Sig Dispense Refill   Cholecalciferol (VITAMIN D3) 50 MCG (2000 UT) TABS Take 2,000 Units by mouth daily.     Coenzyme Q10 (COQ10) 100 MG CAPS Take 100 mg by mouth daily.     fluticasone (FLONASE) 50 MCG/ACT nasal spray Place 1-2 sprays into both nostrils daily as needed for allergies or rhinitis.     ketotifen (ZADITOR) 0.025 % ophthalmic solution Place 1 drop into both eyes 2 (two) times daily as needed (allergies.).     loratadine  (CLARITIN ) 10 MG tablet Take 10 mg by mouth daily as needed for allergies.     metoprolol  succinate (TOPROL  XL) 25 MG 24 hr tablet Take 1 tablet (25 mg total) by mouth daily. 30 tablet 5   Multiple Vitamin (MULTIVITAMIN WITH MINERALS) TABS tablet Take 1 tablet by mouth daily.     Omega-3 1000 MG CAPS Take 1,000 mg by mouth daily.     oxymetazoline (AFRIN) 0.05 % nasal spray Place 1 spray into both nostrils 2 (two) times daily as needed for congestion.     phenylephrine  (SUDAFED PE) 10 MG  TABS tablet Take 10 mg by mouth every 4 (four) hours as needed (congestion.).     Polyethyl Glycol-Propyl Glycol (LUBRICANT EYE DROPS) 0.4-0.3 % SOLN Place 1-2 drops into both eyes 3 (three) times daily as needed (dry/irritated eyes.).     rivaroxaban  (XARELTO ) 10 MG TABS tablet Take 1 tablet (10 mg total) by mouth daily. 30 tablet 6   rosuvastatin  (CRESTOR ) 10 MG tablet Take 10 mg by mouth at bedtime.      sildenafil (REVATIO) 20 MG tablet Take 60-80 mg by mouth daily as needed (erectile dysfunction.).     No current facility-administered medications on file prior to visit.   "

## 2024-03-03 NOTE — Patient Instructions (Signed)
-  Follow up in 6 months with CT scan of chest

## 2025-01-10 ENCOUNTER — Inpatient Hospital Stay: Admitting: Hematology and Oncology

## 2025-01-10 ENCOUNTER — Inpatient Hospital Stay
